# Patient Record
Sex: Female | Born: 1990 | Race: Black or African American | Hispanic: No | Marital: Single | Smoking: Former smoker
Health system: Southern US, Community
[De-identification: ages and names within clinical notes are randomized; demographics above are authoritative.]

## PROBLEM LIST (undated history)

## (undated) ENCOUNTER — Inpatient Hospital Stay (HOSPITAL_COMMUNITY): Payer: Self-pay

## (undated) DIAGNOSIS — F99 Mental disorder, not otherwise specified: Secondary | ICD-10-CM

## (undated) HISTORY — PX: NO PAST SURGERIES: SHX2092

---
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
## 2012-04-02 ENCOUNTER — Ambulatory Visit (INDEPENDENT_AMBULATORY_CARE_PROVIDER_SITE_OTHER): Payer: Medicaid Other | Admitting: *Deleted

## 2012-04-02 DIAGNOSIS — Z111 Encounter for screening for respiratory tuberculosis: Secondary | ICD-10-CM

## 2012-04-04 ENCOUNTER — Ambulatory Visit (INDEPENDENT_AMBULATORY_CARE_PROVIDER_SITE_OTHER): Payer: Medicaid Other | Admitting: *Deleted

## 2012-04-04 DIAGNOSIS — Z111 Encounter for screening for respiratory tuberculosis: Secondary | ICD-10-CM

## 2012-04-04 LAB — TB SKIN TEST
Induration: 0 mm
TB Skin Test: NEGATIVE

## 2012-05-16 NOTE — L&D Delivery Note (Signed)
Delivery Note At 1:52 AM a viable female was delivered via Vaginal, Spontaneous Delivery (Presentation: Left Occiput Anterior).  APGAR: 9, 9; weight .   Placenta status: Intact, Spontaneous.  Cord:  with the following complications: None  Anesthesia: Epidural  Episiotomy: None Lacerations: None Est. Blood Loss (mL): 150 mL  Mom to postpartum.  Baby to nursery-stable.  [name] Other 10/09/2012, 2:12 AM  Delivery was supervised by Nigel Bridgeman, CNM, as I was tied up in another patient's room at time of delivery

## 2012-07-12 ENCOUNTER — Encounter: Payer: Self-pay | Admitting: Family Medicine

## 2012-07-12 ENCOUNTER — Ambulatory Visit (INDEPENDENT_AMBULATORY_CARE_PROVIDER_SITE_OTHER): Payer: Self-pay | Admitting: Family Medicine

## 2012-07-12 VITALS — BP 103/70 | HR 96 | Temp 98.3°F

## 2012-07-12 DIAGNOSIS — K0889 Other specified disorders of teeth and supporting structures: Secondary | ICD-10-CM

## 2012-07-12 DIAGNOSIS — K089 Disorder of teeth and supporting structures, unspecified: Secondary | ICD-10-CM

## 2012-07-12 MED ORDER — TRAMADOL HCL 50 MG PO TABS
50.0000 mg | ORAL_TABLET | Freq: Three times a day (TID) | ORAL | Status: DC | PRN
Start: 2012-07-12 — End: 2012-09-06

## 2012-07-12 NOTE — Patient Instructions (Addendum)
I have sent in a pain medication for you.  You still need to get to the dentist soon as that tooth will continue to hurt until they work on it. If you develop fevers, chills, nausea, or vomiting, please come back to be seen.

## 2012-07-12 NOTE — Progress Notes (Signed)
Patient ID: Kylie Bell, female   DOB: 05-Jul-1990, 22 y.o.   MRN: [id number] Subjective: The patient is a 22 y.o. year old female who presents today for tooth pain, present x2-3 weeks, gradually worsening.  Left upper jaw.  No fevers/chills, n/v/d, problems breathing/facial swelling  Patient's past medical, social, and family history were reviewed and updated as appropriate. History  Substance Use Topics  . Smoking status: Current Every Day Smoker    Types: Cigars  . Smokeless tobacco: Never Used     Comment: 2 black and milds per day; admits to during pregnancy although denied use during clinic visits  . Alcohol Use: Yes   Objective:  Filed Vitals:   07/12/12 1427  BP: 103/70  Pulse: 96  Temp: 98.3 F (36.8 C)   Gen: NAD HEENT: Poor dentition.  Tooth in left upper jaw appears to have crack in it.  No swelling/drainage.  No significant pain with palpation over jaw.  Assessment/Plan: Dental pain from cracked tooth and/or caries.  No evidence of infection.  Will rx pain meds and rec seeing dentist in very near future.  Please also see individual problems in problem list for problem-specific plans.

## 2012-07-13 ENCOUNTER — Telehealth: Payer: Self-pay | Admitting: Family Medicine

## 2012-07-13 NOTE — Telephone Encounter (Signed)
Will forward to Dr Louanne Belton

## 2012-07-13 NOTE — Telephone Encounter (Signed)
Patient is calling because Medicaid doesn't cover the Tramadol so she needs something different that isn't generic.  She says she needs this as soon as possible.

## 2012-07-13 NOTE — Telephone Encounter (Signed)
Tramadol is available from either [facility] or [facility].  #120 (which is less than she has) should cost $15 or less.  There aren't really any less expensive options.

## 2012-07-13 NOTE — Telephone Encounter (Signed)
Pt says she goes to a [facility] and they told her it would cost $30+ and she cant afford that and it's not covered by MCD.

## 2012-07-16 MED ORDER — HYDROCODONE-ACETAMINOPHEN 5-325 MG PO TABS: 1.0000 | ORAL_TABLET | Freq: Three times a day (TID) | ORAL | Status: DC | PRN

## 2012-07-16 NOTE — Telephone Encounter (Signed)
Would you, or Larita Fife, please call in norco 5/325, #30, with directions to take up to 3 times daily as needed for pain.  No refills.  She will need to get in to see a dentist by the time that runs out.

## 2012-07-18 NOTE — Telephone Encounter (Signed)
Norco order called to [facility] per Dr Pasty Spillers advise pt if she calls back. LM for pt to rt call,

## 2012-07-19 NOTE — Telephone Encounter (Signed)
Called pt. Left message to call back. Please tell pt to check pharmacy for her meds (Norco). See previous message. Lorenda Hatchet, Renato Battles

## 2012-07-23 NOTE — Telephone Encounter (Signed)
Pt never called back. .Slade, The[place]la  

## 2012-09-06 ENCOUNTER — Inpatient Hospital Stay (HOSPITAL_COMMUNITY): Payer: Medicaid Other

## 2012-09-06 ENCOUNTER — Encounter (HOSPITAL_COMMUNITY): Payer: Self-pay | Admitting: *Deleted

## 2012-09-06 ENCOUNTER — Inpatient Hospital Stay (HOSPITAL_COMMUNITY)
Admission: AD | Admit: 2012-09-06 | Discharge: 2012-09-06 | Disposition: A | Discharge status: Home / Self Care | Payer: Medicaid Other | Source: Ambulatory Visit | Attending: Obstetrics & Gynecology | Admitting: Obstetrics & Gynecology

## 2012-09-06 DIAGNOSIS — O26879 Cervical shortening, unspecified trimester: Secondary | ICD-10-CM | POA: Insufficient documentation

## 2012-09-06 DIAGNOSIS — R109 Unspecified abdominal pain: Secondary | ICD-10-CM | POA: Insufficient documentation

## 2012-09-06 DIAGNOSIS — O0930 Supervision of pregnancy with insufficient antenatal care, unspecified trimester: Secondary | ICD-10-CM | POA: Insufficient documentation

## 2012-09-06 LAB — URINALYSIS, ROUTINE W REFLEX MICROSCOPIC
Glucose, UA: NEGATIVE mg/dL
Ketones, ur: NEGATIVE mg/dL
Leukocytes, UA: NEGATIVE
pH: 6.5 (ref 5.0–8.0)

## 2012-09-06 LAB — WET PREP, GENITAL
Trich, Wet Prep: NONE SEEN
Yeast Wet Prep HPF POC: NONE SEEN

## 2012-09-06 MED ORDER — ACETAMINOPHEN 325 MG PO TABS: 650.0000 mg | ORAL_TABLET | Freq: Four times a day (QID) | ORAL | Status: DC | PRN

## 2012-09-06 MED ORDER — PROGESTERONE MICRONIZED 200 MG PO CAPS: ORAL_CAPSULE | ORAL | Status: DC

## 2012-09-06 MED ORDER — BETAMETHASONE SOD PHOS & ACET 6 (3-3) MG/ML IJ SUSP
12.0000 mg | Freq: Once | INTRAMUSCULAR | Status: AC
  Administered 2012-09-06: 12 mg via INTRAMUSCULAR
  Filled 2012-09-06: qty 2

## 2012-09-06 MED ORDER — PRENATAL MULTIVITAMIN CH: 1.0000 | ORAL_TABLET | Freq: Every day | ORAL | Status: DC

## 2012-09-06 NOTE — MAU Note (Signed)
abd pain- in lower abd for past 2 days.  Knows she is pregnant. No prenatal care, still be bleeding.   Has a 65 month old.

## 2012-09-06 NOTE — MAU Provider Note (Signed)
History     CSN: [id number]  Arrival date and time: 09/06/12 1215   None     Chief Complaint  Patient presents with  . Abdominal Pain   HPI Comments: Pt is a 22 y.o. G2P1001 at unknown gestation but approximately [redacted]w[redacted]d by uncertain LMP who presents today for suprapubic and r sided pain X 1 day.  She is unsure of how far along she is in pregnancy.  She delivered her last baby in June.  Reports that her bleeding lasted approximately 4-6 weeks.  She then remembers having one period in August.  She has some subsequent bleeding in November the cannot recall this was similar her normal menses.  She reports she has been feeling well otherwise.  Having no headaches, vision changes, nausea, vomiting, or swelling.  She has started noticing the baby move and has noticed good fetal movement the past couple of weeks.  She has no dysuria, frequency, no hesitancy.  She reports no discharge.  Ligating from August 1 she is approximately 36 weeks.  She has previously been [facility] by Dr. Louanne Belton.  She did see him back in January but may no mention of potentially been pregnant as at that time she was unaware.  She reports not having to have gone to get her Medicaid card due to scheduling conflicts with her work.  She works third shift and Lurena Nida desires to continue working to the pregnancy.  He has not been taking prenatal vitamin reports that she did purchase some yesterday.   OB History   Grav Para Term Preterm Abortions TAB SAB Ect Mult Living   2 1 1  0 0 0 0 0 0 1      Past Medical History  Diagnosis Date  . Mental disorder     ADHD    Past Surgical History  Procedure Laterality Date  . No past surgeries      Family History  Problem Relation Age of Onset  . Diabetes Mother   . Kidney disease Mother   . Pancreatitis Mother   . Gout Father   . Cancer Maternal Grandmother   . Gout Paternal Grandmother   . Diabetes Paternal Grandmother     History   Substance Use Topics  . Smoking status: Current Every Day Smoker    Types: Cigars  . Smokeless tobacco: Never Used     Comment: 2 black and milds per day; admits to during pregnancy although denied use during clinic visits  . Alcohol Use: Yes    Allergies: No Known Allergies  Prescriptions prior to admission  Medication Sig Dispense Refill  . Multiple Vitamin (MULTIVITAMIN WITH MINERALS) TABS Take 1 tablet by mouth daily.  30 tablet  3  . traMADol (ULTRAM) 50 MG tablet Take 1-2 tablets (50-100 mg total) by mouth every 8 (eight) hours as needed for pain.  60 tablet  0    Review of Systems  Constitutional: Negative.  Negative for fever and chills.  HENT: Negative.  Negative for congestion.   Eyes: Negative.  Negative for blurred vision and double vision.  Respiratory: Negative.  Negative for cough and wheezing.   Cardiovascular: Negative.  Negative for leg swelling.  Gastrointestinal: Negative.  Negative for heartburn, nausea and vomiting.  Genitourinary: Negative.  Negative for dysuria, urgency and frequency.  Musculoskeletal: Negative.  Negative for myalgias, back pain and falls.  Skin: Negative.   Neurological: Negative.  Negative for headaches.  Endo/Heme/Allergies: Negative.   Psychiatric/Behavioral: Negative.  Physical Exam   Blood pressure 105/62, pulse 70, temperature 97.9 F (36.6 C), temperature source Oral, resp. rate 18, height 4\' 11"  (1.499 m), weight 79.379 kg (175 lb), last menstrual period 12/29/2011.  Physical Exam  Nursing note and vitals reviewed. Constitutional: She is oriented to person, place, and time. She appears well-developed and well-nourished. No distress.  HENT:  Head: Normocephalic and atraumatic.  Eyes: Conjunctivae are normal. Pupils are equal, round, and reactive to light.  Neck: Normal range of motion. No JVD present. No tracheal deviation present.  Cardiovascular: Normal rate.   Respiratory: Effort normal. No respiratory distress.  GI:  Soft. She exhibits distension (Gravid).  Genitourinary: Uterus is enlarged (gravid.  Measure to 28cm on abdominal measurement). Cervix exhibits discharge. Cervix exhibits no motion tenderness and no friability. Right adnexum displays tenderness (R > L mild over round ligament). Right adnexum displays no mass. Left adnexum displays no mass. No tenderness around the vagina. Vaginal discharge (white with greenish/yellowsih tint.  Copious) found.  Musculoskeletal: She exhibits no edema and no tenderness.  Neurological: She is alert and oriented to person, place, and time. She exhibits normal muscle tone.  Skin: Skin is warm and dry. No rash noted. She is not diaphoretic. No erythema. No pallor.  Psychiatric: She has a normal mood and affect. Her behavior is normal. Judgment and thought content normal.      MAU Course  Procedures  MDM 2:25 PM - Pt to [place] for dating only.  Collected urinalysis, GC/Chlamydia, wet prep.      Assessment and Plan   Shortened Cervix   Late to Prenatal Care  Round Ligament Pain   Plan: DC to home RX Prometrium 200 mg HS per vagina Appointment at [facility] on Sep 27, 2012 at 08:00am Outpatient ultrasound for anatomy. BMZ 12 mg IM today, repeat in 24 hours Tylenol for round ligament pain   Andrena Mews, DO [facility] Resident - PGY-2 09/06/2012 2:26 PM

## 2012-09-07 ENCOUNTER — Inpatient Hospital Stay (HOSPITAL_COMMUNITY)
Admission: AD | Admit: 2012-09-07 | Discharge: 2012-09-07 | Disposition: A | Discharge status: Home / Self Care | Payer: Medicaid Other | Source: Ambulatory Visit | Attending: Obstetrics & Gynecology | Admitting: Obstetrics & Gynecology

## 2012-09-07 DIAGNOSIS — O4700 False labor before 37 completed weeks of gestation, unspecified trimester: Secondary | ICD-10-CM | POA: Insufficient documentation

## 2012-09-07 LAB — GC/CHLAMYDIA PROBE AMP: CT Probe RNA: NEGATIVE

## 2012-09-07 MED ORDER — BETAMETHASONE SOD PHOS & ACET 6 (3-3) MG/ML IJ SUSP
12.0000 mg | Freq: Once | INTRAMUSCULAR | Status: AC
  Administered 2012-09-07: 12 mg via INTRAMUSCULAR
  Filled 2012-09-07: qty 2

## 2012-09-09 ENCOUNTER — Inpatient Hospital Stay (HOSPITAL_COMMUNITY)
Admission: AD | Admit: 2012-09-09 | Discharge: 2012-09-09 | Disposition: A | Discharge status: Home / Self Care | Payer: Medicaid Other | Source: Ambulatory Visit | Attending: Obstetrics & Gynecology | Admitting: Obstetrics & Gynecology

## 2012-09-09 ENCOUNTER — Encounter (HOSPITAL_COMMUNITY): Payer: Self-pay

## 2012-09-09 DIAGNOSIS — N949 Unspecified condition associated with female genital organs and menstrual cycle: Secondary | ICD-10-CM | POA: Insufficient documentation

## 2012-09-09 DIAGNOSIS — O26899 Other specified pregnancy related conditions, unspecified trimester: Secondary | ICD-10-CM

## 2012-09-09 DIAGNOSIS — O26893 Other specified pregnancy related conditions, third trimester: Secondary | ICD-10-CM

## 2012-09-09 DIAGNOSIS — O99891 Other specified diseases and conditions complicating pregnancy: Secondary | ICD-10-CM | POA: Insufficient documentation

## 2012-09-09 LAB — WET PREP, GENITAL
Trich, Wet Prep: NONE SEEN
Yeast Wet Prep HPF POC: NONE SEEN

## 2012-09-09 NOTE — MAU Provider Note (Signed)
History     CSN: [id number]  Arrival date and time: 09/09/12 2129   First Provider Initiated Contact with Patient 09/09/12 2204      Chief Complaint  Patient presents with  . Rupture of Membranes   HPI Ms. Kylie Bell is a 22 y.o. G2P1001 at [redacted]w[redacted]d who presents to MAU today with complaint of possible ROM. The patient states that while she was getting ready to leave for work she felt some fluid leaking from her vagina. She did not feel a gush. She is having some lower abdominal pressure. Denies pain, contractions, vaginal discharge or vaginal bleeding. [place] on 09/06/12 showed short cervix. Patient has not yet started Rx for Prometrium. She reports good fetal movement.    OB History   Grav Para Term Preterm Abortions TAB SAB Ect Mult Living   2 1 1  0 0 0 0 0 0 1      Past Medical History  Diagnosis Date  . Mental disorder     ADHD    Past Surgical History  Procedure Laterality Date  . No past surgeries      Family History  Problem Relation Age of Onset  . Diabetes Mother   . Kidney disease Mother   . Pancreatitis Mother   . Gout Father   . Cancer Maternal Grandmother   . Gout Paternal Grandmother   . Diabetes Paternal Grandmother     History  Substance Use Topics  . Smoking status: Former Smoker    Types: Cigars  . Smokeless tobacco: Never Used     Comment: 2 black and milds per day; admits to during pregnancy although denied use during clinic visits  . Alcohol Use: Yes    Allergies: No Known Allergies  Prescriptions prior to admission  Medication Sig Dispense Refill  . acetaminophen (TYLENOL) 325 MG tablet Take 2 tablets (650 mg total) by mouth every 6 (six) hours as needed for pain.      . Prenatal Vit-Fe Fumarate-FA (PRENATAL MULTIVITAMIN) TABS Take 1 tablet by mouth daily at 12 noon.      . progesterone (PROMETRIUM) 200 MG capsule Insert in vagina nightly  30 capsule  1    Review of Systems  Gastrointestinal: Negative for abdominal pain.   Genitourinary: Negative for dysuria, urgency and frequency.       + LOF Neg - vaginal discharge, bleeding   Physical Exam   Blood pressure 125/55, pulse 70, temperature 97.9 F (36.6 C), temperature source Oral, resp. rate 18, height 4\' 11"  (1.499 m), weight 177 lb (80.287 kg), last menstrual period 12/29/2011, SpO2 100.00%.  Physical Exam  Constitutional: She is oriented to person, place, and time. She appears well-developed and well-nourished. No distress.  HENT:  Head: Normocephalic and atraumatic.  Cardiovascular: Normal rate, regular rhythm and normal heart sounds.   Respiratory: Effort normal and breath sounds normal. No respiratory distress.  GI: Soft. Bowel sounds are normal. She exhibits no distension and no mass. There is no tenderness. There is no rebound and no guarding.  Genitourinary: Uterus is enlarged (appropriate for GA). Uterus is not tender. Cervix exhibits friability. Cervix exhibits no motion tenderness and no discharge. No bleeding around the vagina. Vaginal discharge (scant amount of mucus discharge noted) found.  Pooling - negative Fern - negative  Neurological: She is alert and oriented to person, place, and time.  Skin: Skin is warm and dry. No erythema.  Psychiatric: She has a normal mood and affect.  Dilation: 1 Effacement (%): 60 Station: -  3 Presentation: Vertex Exam by:: Lucy Chris RNC  Results for orders placed during the hospital encounter of 09/09/12 (from the past 24 hour(s))  WET PREP, GENITAL     Status: Abnormal   Collection Time    09/09/12 10:23 PM      Result Value Range   Yeast Wet Prep HPF POC NONE SEEN  NONE SEEN   Trich, Wet Prep NONE SEEN  NONE SEEN   Clue Cells Wet Prep HPF POC NONE SEEN  NONE SEEN   WBC, Wet Prep HPF POC FEW (*) NONE SEEN  POCT FERN TEST     Status: None   Collection Time    09/09/12 10:31 PM      Result Value Range   POCT Fern Test Negative = intact amniotic membranes      Fetal monitoring: Baseline: 130  bpm, moderate variability, + accelerations, no decelerations Contractions: occasional  MAU Course  Procedures None  MDM Fern slide negative. No pooling. No contractions. Reactive NST. Wet prep negative.   Assessment and Plan  A: Vaginal discharge  P: Discharge home Encouraged patient to start Rx given at last visit Encouraged patient to keep appointment to start prenatal care in clinic Labor precautions discussed Patient may return to MAU as needed or if her condition were to change or worsen  Freddi Starr, PA-C  09/09/2012, 11:15 PM

## 2012-09-09 NOTE — MAU Note (Signed)
Pt reports she had a gush of fluid at 2100, states it was not urine.

## 2012-09-27 ENCOUNTER — Ambulatory Visit (INDEPENDENT_AMBULATORY_CARE_PROVIDER_SITE_OTHER): Payer: Self-pay | Admitting: Obstetrics & Gynecology

## 2012-09-27 ENCOUNTER — Encounter: Payer: Self-pay | Admitting: Obstetrics & Gynecology

## 2012-09-27 VITALS — BP 111/68 | Temp 98.8°F | Wt 176.3 lb

## 2012-09-27 DIAGNOSIS — O0930 Supervision of pregnancy with insufficient antenatal care, unspecified trimester: Secondary | ICD-10-CM | POA: Insufficient documentation

## 2012-09-27 DIAGNOSIS — O0933 Supervision of pregnancy with insufficient antenatal care, third trimester: Secondary | ICD-10-CM

## 2012-09-27 LAB — POCT URINALYSIS DIP (DEVICE)
Bilirubin Urine: NEGATIVE
Ketones, ur: NEGATIVE mg/dL
Protein, ur: NEGATIVE mg/dL

## 2012-09-27 NOTE — Progress Notes (Signed)
   Subjective:    Kylie Bell is a G2P1001 [redacted]w[redacted]d being seen today for her first obstetrical visit.  Her obstetrical history is significant for late prenatal care. Patient does not intend to breast feed. Pregnancy history fully reviewed.  Patient reports no contractions.  Filed Vitals:   09/27/12 0829  BP: 111/68  Temp: 98.8 F (37.1 C)  Weight: 176 lb 4.8 oz (79.969 kg)    HISTORY: OB History   Grav Para Term Preterm Abortions TAB SAB Ect Mult Living   2 1 1  0 0 0 0 0 0 1     # Outc Date GA Lbr Len/2nd Wgt Sex Del Anes PTL Lv   1 TRM 6/13 [redacted]w[redacted]d 20:30 / 00:48 7lb1.9oz(3.23kg) F SVD EPI  Yes   2 CUR              Past Medical History  Diagnosis Date  . Mental disorder     ADHD   Past Surgical History  Procedure Laterality Date  . No past surgeries     Family History  Problem Relation Age of Onset  . Diabetes Mother   . Kidney disease Mother   . Pancreatitis Mother   . Gout Father   . Cancer Maternal Grandmother   . Gout Paternal Grandmother   . Diabetes Paternal Grandmother      Exam    Uterus:     Pelvic Exam:    Perineum: No Hemorrhoids   Vulva: normal   Vagina:  normal mucosa   pH:     Cervix: no lesions and 2 cm/50%/vtx   Adnexa: no mass, fullness, tenderness   Bony Pelvis: average  System: Breast:  normal appearance, no masses or tenderness   Skin: normal coloration and turgor, no rashes    Neurologic: oriented, normal mood   Extremities: normal strength, tone, and muscle mass   HEENT RRR   Mouth/Teeth mucous membranes moist, pharynx normal without lesions   Neck supple and no masses   Cardiovascular: regular rate and rhythm   Respiratory:  appears well, vitals normal, no respiratory distress, acyanotic, normal RR   Abdomen: normal findings: gravid, NT   Urinary: urethral meatus normal      Assessment:    Pregnancy: G2P1001 Patient Active Problem List   Diagnosis Date Noted  . Late prenatal care complicating pregnancy 09/27/2012   . ATTENTION DEFICIT, W/HYPERACTIVITY 07/13/2006  . ECZEMA, ATOPIC DERMATITIS 07/13/2006  . DELAYED MILESTONE 07/13/2006        Plan:     Initial labs drawn. Prenatal vitamins. Problem list reviewed and updated. Genetic Screening discussed too late.  Ultrasound discussed; fetal survey: requested.  Follow up in 1 weeks. 50% of 30 min visit spent on counseling and coordination of care.  GC/CT, GBS done,   Modesta Sammons 09/27/2012

## 2012-09-27 NOTE — Patient Instructions (Addendum)
Pregnancy - Third Trimester  The third trimester of pregnancy (the last 3 months) is a period of the most rapid growth for you and your baby. The baby approaches a length of 20 inches and a weight of 6 to 10 pounds. The baby is adding on fat and getting ready for life outside your body. While inside, babies have periods of sleeping and waking, suck their thumbs, and hiccups. You can often feel small contractions of the uterus. This is false labor. It is also called Braxton-Hicks contractions. This is like a practice for labor. The usual problems in this stage of pregnancy include more difficulty breathing, swelling of the hands and feet from water retention, and having to urinate more often because of the uterus and baby pressing on your bladder.   PRENATAL EXAMS  · Blood work may continue to be done during prenatal exams. These tests are done to check on your health and the probable health of your baby. Blood work is used to follow your blood levels (hemoglobin). Anemia (low hemoglobin) is common during pregnancy. Iron and vitamins are given to help prevent this. You may also continue to be checked for diabetes. Some of the past blood tests may be done again.  · The size of the uterus is measured during each visit. This makes sure your baby is growing properly according to your pregnancy dates.  · Your blood pressure is checked every prenatal visit. This is to make sure you are not getting toxemia.  · Your urine is checked every prenatal visit for infection, diabetes and protein.  · Your weight is checked at each visit. This is done to make sure gains are happening at the suggested rate and that you and your baby are growing normally.  · Sometimes, an ultrasound is performed to confirm the position and the proper growth and development of the baby. This is a test done that bounces harmless sound waves off the baby so your caregiver can more accurately determine due dates.  · Discuss the type of pain medication and  anesthesia you will have during your labor and delivery.  · Discuss the possibility and anesthesia if a Cesarean Section might be necessary.  · Inform your caregiver if there is any mental or physical violence at home.  Sometimes, a specialized non-stress test, contraction stress test and biophysical profile are done to make sure the baby is not having a problem. Checking the amniotic fluid surrounding the baby is called an amniocentesis. The amniotic fluid is removed by sticking a needle into the belly (abdomen). This is sometimes done near the end of pregnancy if an early delivery is required. In this case, it is done to help make sure the baby's lungs are mature enough for the baby to live outside of the womb. If the lungs are not mature and it is unsafe to deliver the baby, an injection of cortisone medication is given to the mother 1 to 2 days before the delivery. This helps the baby's lungs mature and makes it safer to deliver the baby.  CHANGES OCCURING IN THE THIRD TRIMESTER OF PREGNANCY  Your body goes through many changes during pregnancy. They vary from person to person. Talk to your caregiver about changes you notice and are concerned about.  · During the last trimester, you have probably had an increase in your appetite. It is normal to have cravings for certain foods. This varies from person to person and pregnancy to pregnancy.  · You may begin to   get stretch marks on your hips, abdomen, and breasts. These are normal changes in the body during pregnancy. There are no exercises or medications to take which prevent this change.  · Constipation may be treated with a stool softener or adding bulk to your diet. Drinking lots of fluids, fiber in vegetables, fruits, and whole grains are helpful.  · Exercising is also helpful. If you have been very active up until your pregnancy, most of these activities can be continued during your pregnancy. If you have been less active, it is helpful to start an exercise  program such as walking. Consult your caregiver before starting exercise programs.  · Avoid all smoking, alcohol, un-prescribed drugs, herbs and "street drugs" during your pregnancy. These chemicals affect the formation and growth of the baby. Avoid chemicals throughout the pregnancy to ensure the delivery of a healthy infant.  · Backache, varicose veins and hemorrhoids may develop or get worse.  · You will tire more easily in the third trimester, which is normal.  · The baby's movements may be stronger and more often.  · You may become short of breath easily.  · Your belly button may stick out.  · A yellow discharge may leak from your breasts called colostrum.  · You may have a bloody mucus discharge. This usually occurs a few days to a week before labor begins.  HOME CARE INSTRUCTIONS   · Keep your caregiver's appointments. Follow your caregiver's instructions regarding medication use, exercise, and diet.  · During pregnancy, you are providing food for you and your baby. Continue to eat regular, well-balanced meals. Choose foods such as meat, fish, milk and other low fat dairy products, vegetables, fruits, and whole-grain breads and cereals. Your caregiver will tell you of the ideal weight gain.  · A physical sexual relationship may be continued throughout pregnancy if there are no other problems such as early (premature) leaking of amniotic fluid from the membranes, vaginal bleeding, or belly (abdominal) pain.  · Exercise regularly if there are no restrictions. Check with your caregiver if you are unsure of the safety of your exercises. Greater weight gain will occur in the last 2 trimesters of pregnancy. Exercising helps:  · Control your weight.  · Get you in shape for labor and delivery.  · You lose weight after you deliver.  · Rest a lot with legs elevated, or as needed for leg cramps or low back pain.  · Wear a good support or jogging bra for breast tenderness during pregnancy. This may help if worn during  sleep. Pads or tissues may be used in the bra if you are leaking colostrum.  · Do not use hot tubs, steam rooms, or saunas.  · Wear your seat belt when driving. This protects you and your baby if you are in an accident.  · Avoid raw meat, cat litter boxes and soil used by cats. These carry germs that can cause birth defects in the baby.  · It is easier to loose urine during pregnancy. Tightening up and strengthening the pelvic muscles will help with this problem. You can practice stopping your urination while you are going to the bathroom. These are the same muscles you need to strengthen. It is also the muscles you would use if you were trying to stop from passing gas. You can practice tightening these muscles up 10 times a set and repeating this about 3 times per day. Once you know what muscles to tighten up, do not perform these   exercises during urination. It is more likely to cause an infection by backing up the urine.  · Ask for help if you have financial, counseling or nutritional needs during pregnancy. Your caregiver will be able to offer counseling for these needs as well as refer you for other special needs.  · Make a list of emergency phone numbers and have them available.  · Plan on getting help from family or friends when you go home from the hospital.  · Make a trial run to the hospital.  · Take prenatal classes with the father to understand, practice and ask questions about the labor and delivery.  · Prepare the baby's room/nursery.  · Do not travel out of the city unless it is absolutely necessary and with the advice of your caregiver.  · Wear only low or no heal shoes to have better balance and prevent falling.  MEDICATIONS AND DRUG USE IN PREGNANCY  · Take prenatal vitamins as directed. The vitamin should contain 1 milligram of folic acid. Keep all vitamins out of reach of children. Only a couple vitamins or tablets containing iron may be fatal to a baby or young child when ingested.  · Avoid use  of all medications, including herbs, over-the-counter medications, not prescribed or suggested by your caregiver. Only take over-the-counter or prescription medicines for pain, discomfort, or fever as directed by your caregiver. Do not use aspirin, ibuprofen (Motrin®, Advil®, Nuprin®) or naproxen (Aleve®) unless OK'd by your caregiver.  · Let your caregiver also know about herbs you may be using.  · Alcohol is related to a number of birth defects. This includes fetal alcohol syndrome. All alcohol, in any form, should be avoided completely. Smoking will cause low birth rate and premature babies.  · Street/illegal drugs are very harmful to the baby. They are absolutely forbidden. A baby born to an addicted mother will be addicted at birth. The baby will go through the same withdrawal an adult does.  SEEK MEDICAL CARE IF:  You have any concerns or worries during your pregnancy. It is better to call with your questions if you feel they cannot wait, rather than worry about them.  DECISIONS ABOUT CIRCUMCISION  You may or may not know the sex of your baby. If you know your baby is a boy, it may be time to think about circumcision. Circumcision is the removal of the foreskin of the penis. This is the skin that covers the sensitive end of the penis. There is no proven medical need for this. Often this decision is made on what is popular at the time or based upon religious beliefs and social issues. You can discuss these issues with your caregiver or pediatrician.  SEEK IMMEDIATE MEDICAL CARE IF:   · An unexplained oral temperature above 102° F (38.9° C) develops, or as your caregiver suggests.  · You have leaking of fluid from the vagina (birth canal). If leaking membranes are suspected, take your temperature and tell your caregiver of this when you call.  · There is vaginal spotting, bleeding or passing clots. Tell your caregiver of the amount and how many pads are used.  · You develop a bad smelling vaginal discharge with  a change in the color from clear to white.  · You develop vomiting that lasts more than 24 hours.  · You develop chills or fever.  · You develop shortness of breath.  · You develop burning on urination.  · You loose more than 2 pounds of weight   or gain more than 2 pounds of weight or as suggested by your caregiver.  · You notice sudden swelling of your face, hands, and feet or legs.  · You develop belly (abdominal) pain. Round ligament discomfort is a common non-cancerous (benign) cause of abdominal pain in pregnancy. Your caregiver still must evaluate you.  · You develop a severe headache that does not go away.  · You develop visual problems, blurred or double vision.  · If you have not felt your baby move for more than 1 hour. If you think the baby is not moving as much as usual, eat something with sugar in it and lie down on your left side for an hour. The baby should move at least 4 to 5 times per hour. Call right away if your baby moves less than that.  · You fall, are in a car accident or any kind of trauma.  · There is mental or physical violence at home.  Document Released: 04/26/2001 Document Revised: 07/25/2011 Document Reviewed: 10/29/2008  ExitCare® Patient Information ©2013 [employer].

## 2012-09-27 NOTE — Progress Notes (Signed)
U/S scheduled 10/01/12 at 1 pm.

## 2012-09-28 LAB — OBSTETRIC PANEL
Antibody Screen: NEGATIVE
Basophils Relative: 0 % (ref 0–1)
Eosinophils Absolute: 0 10*3/uL (ref 0.0–0.7)
Eosinophils Relative: 0 % (ref 0–5)
HCT: 27.1 % — ABNORMAL LOW (ref 36.0–46.0)
Hemoglobin: 8.5 g/dL — ABNORMAL LOW (ref 12.0–15.0)
MCH: 21.3 pg — ABNORMAL LOW (ref 26.0–34.0)
MCHC: 31.4 g/dL (ref 30.0–36.0)
Monocytes Absolute: 0.4 10*3/uL (ref 0.1–1.0)
Monocytes Relative: 7 % (ref 3–12)
Rh Type: POSITIVE

## 2012-09-28 LAB — DRUG SCREEN, URINE
Benzodiazepines.: NEGATIVE
Marijuana Metabolite: NEGATIVE
Methadone: NEGATIVE
Propoxyphene: NEGATIVE

## 2012-09-28 LAB — HIV ANTIBODY (ROUTINE TESTING W REFLEX): HIV: NONREACTIVE

## 2012-09-28 LAB — GLUCOSE TOLERANCE, 1 HOUR (50G) W/O FASTING: Glucose, 1 Hour GTT: 95 mg/dL (ref 70–140)

## 2012-09-28 LAB — GC/CHLAMYDIA PROBE AMP: CT Probe RNA: NEGATIVE

## 2012-09-30 LAB — CULTURE, OB URINE

## 2012-10-01 ENCOUNTER — Other Ambulatory Visit: Payer: Self-pay | Admitting: Obstetrics & Gynecology

## 2012-10-01 ENCOUNTER — Ambulatory Visit (HOSPITAL_COMMUNITY)
Admission: RE | Admit: 2012-10-01 | Discharge: 2012-10-01 | Disposition: A | Discharge status: Home / Self Care | Payer: Medicaid Other | Source: Ambulatory Visit | Attending: Obstetrics & Gynecology | Admitting: Obstetrics & Gynecology

## 2012-10-01 DIAGNOSIS — Z3689 Encounter for other specified antenatal screening: Secondary | ICD-10-CM | POA: Insufficient documentation

## 2012-10-01 DIAGNOSIS — O0933 Supervision of pregnancy with insufficient antenatal care, third trimester: Secondary | ICD-10-CM

## 2012-10-01 DIAGNOSIS — O0930 Supervision of pregnancy with insufficient antenatal care, unspecified trimester: Secondary | ICD-10-CM | POA: Insufficient documentation

## 2012-10-01 LAB — HEMOGLOBINOPATHY EVALUATION
Hgb A2 Quant: 2.2 % (ref 2.2–3.2)
Hgb A: 97.8 % (ref 96.8–97.8)

## 2012-10-02 ENCOUNTER — Encounter (HOSPITAL_COMMUNITY): Payer: Self-pay | Admitting: *Deleted

## 2012-10-02 ENCOUNTER — Inpatient Hospital Stay (HOSPITAL_COMMUNITY)
Admission: AD | Admit: 2012-10-02 | Discharge: 2012-10-03 | Disposition: A | Discharge status: Home / Self Care | Payer: Medicaid Other | Source: Ambulatory Visit | Attending: Family Medicine | Admitting: Family Medicine

## 2012-10-02 DIAGNOSIS — R109 Unspecified abdominal pain: Secondary | ICD-10-CM | POA: Insufficient documentation

## 2012-10-02 DIAGNOSIS — O99891 Other specified diseases and conditions complicating pregnancy: Secondary | ICD-10-CM | POA: Insufficient documentation

## 2012-10-02 LAB — URINALYSIS, ROUTINE W REFLEX MICROSCOPIC
Bilirubin Urine: NEGATIVE
Protein, ur: NEGATIVE mg/dL
Urobilinogen, UA: 2 mg/dL — ABNORMAL HIGH (ref 0.0–1.0)

## 2012-10-02 NOTE — MAU Note (Signed)
Stomach tightening and having lower abdominal pain after eating, hurts worse when walking, denies vaginal bleeding.

## 2012-10-02 NOTE — MAU Note (Signed)
Pt states her stomach gets really tight. Pt states she felt the tightening 6 times today

## 2012-10-03 LAB — URINE CULTURE: Colony Count: 70000

## 2012-10-04 ENCOUNTER — Ambulatory Visit (INDEPENDENT_AMBULATORY_CARE_PROVIDER_SITE_OTHER): Payer: Medicaid Other | Admitting: Family Medicine

## 2012-10-04 VITALS — BP 98/63 | Temp 98.4°F | Wt 177.1 lb

## 2012-10-04 DIAGNOSIS — R82998 Other abnormal findings in urine: Secondary | ICD-10-CM

## 2012-10-04 DIAGNOSIS — O0930 Supervision of pregnancy with insufficient antenatal care, unspecified trimester: Secondary | ICD-10-CM

## 2012-10-04 DIAGNOSIS — O0933 Supervision of pregnancy with insufficient antenatal care, third trimester: Secondary | ICD-10-CM

## 2012-10-04 DIAGNOSIS — R8281 Pyuria: Secondary | ICD-10-CM

## 2012-10-04 LAB — POCT URINALYSIS DIP (DEVICE)
Ketones, ur: NEGATIVE mg/dL
Protein, ur: 30 mg/dL — AB
Specific Gravity, Urine: 1.025 (ref 1.005–1.030)
pH: 6.5 (ref 5.0–8.0)

## 2012-10-04 NOTE — Patient Instructions (Signed)
Vaginal Delivery  Your caregiver must first be sure you are in labor. Signs of labor include:   You may pass what is called "the mucus plug" before labor begins. This is a small amount of blood stained mucus.   Regular uterine contractions.   The time between contractions get closer together.   The discomfort and pain gradually gets more intense.   Pains are mostly located in the back.   Pains get worse when walking.   The cervix (the opening of the uterus) becomes thinner (begins to efface) and opens up (dilates).  Once you are in labor and admitted into the hospital or care center, your caregiver will do the following:   A complete physical examination.   Check your vital signs (blood pressure, pulse, temperature and the fetal heart rate).   Do a vaginal examination (using a sterile glove and lubricant) to determine:   The position (presentation) of the baby (head [vertex] or buttock first).   The level (station) of the baby's head in the birth canal.   The effacement and dilatation of the cervix.   You may have your pubic hair shaved and be given an enema depending on your caregiver and the circumstance.   An electronic monitor is usually placed on your abdomen. The monitor follows the length and intensity of the contractions, as well as the baby's heart rate.   Usually, your caregiver will insert an IV in your arm with a bottle of sugar water. This is done as a precaution so that medications can be given to you quickly during labor or delivery.  NORMAL LABOR AND DELIVERY IS DIVIDED UP INTO 3 STAGES:  First Stage  This is when regular contractions begin and the cervix begins to efface and dilate. This stage can last from 3 to 15 hours. The end of the first stage is when the cervix is 100% effaced and 10 centimeters dilated. Pain medications may be given by    Injection (morphine, demerol, etc.)    Regional anesthesia (spinal, caudal or epidural, anesthetics given in different locations of the spine). Paracervical pain medication may be given, which is an injection of and anesthetic on each side of the cervix.  A pregnant woman may request to have "Natural Childbirth" which is not to have any medications or anesthesia during her labor and delivery.  Second Stage  This is when the baby comes down through the birth canal (vagina) and is born. This can take 1 to 4 hours. As the baby's head comes down through the birth canal, you may feel like you are going to have a bowel movement. You will get the urge to bear down and push until the baby is delivered. As the baby's head is being delivered, the caregiver will decide if an episiotomy (a cut in the perineum and vagina area) is needed to prevent tearing of the tissue in this area. The episiotomy is sewn up after the delivery of the baby and placenta. Sometimes a mask with nitrous oxide is given for the mother to breath during the delivery of the baby to help if there is too much pain. The end of Stage 2 is when the baby is fully delivered. Then when the umbilical cord stops pulsating it is clamped and cut.  Third Stage  The third stage begins after the baby is completely delivered and ends after the placenta (afterbirth) is delivered. This usually takes 5 to 30 minutes. After the placenta is delivered, a medication is given   either by intravenous or injection to help contract the uterus and prevent bleeding. The third stage is not painful and pain medication is usually not necessary. If an episiotomy was done, it is repaired at this time.  After the delivery, the mother is watched and monitored closely for 1 to 2 hours to make sure there is no postpartum bleeding (hemorrhage). If there is a lot of bleeding, medication is given to contract the uterus and stop the bleeding.  Document Released: 02/09/2008 Document Revised: 01/25/2012 Document Reviewed: 02/09/2008   [employer] Patient Information 2014 [employer].

## 2012-10-04 NOTE — Progress Notes (Signed)
P = 77 

## 2012-10-04 NOTE — Progress Notes (Signed)
Kylie Bell is a 22 y.o. female G2P1001 at [redacted]w[redacted]d who presents to the [facility] for rountine checkup.  She denies any complaints.  She denies cramping or contractions, but feels the baby move regularly. She denies discharge or vaginal bleeding.  She had a SVD with her first child with no complications.  She is tolerating food and drink without complication.  She would like the Depo Shot for her birth control needs and is hoping to be seen here postpartum.  She is not planning to breast feed.

## 2012-10-04 NOTE — Progress Notes (Signed)
I saw and examined patient and agree with student note. Moderate leukocytes on UA. Last visit, culture showed 70,000 cfu mixed flora but will reculture today to be sure there is no UTI. Asymptomatic.

## 2012-10-06 LAB — CULTURE, OB URINE

## 2012-10-08 ENCOUNTER — Encounter (HOSPITAL_COMMUNITY): Payer: Self-pay | Admitting: *Deleted

## 2012-10-08 ENCOUNTER — Inpatient Hospital Stay (HOSPITAL_COMMUNITY)
Admission: AD | Admit: 2012-10-08 | Discharge: 2012-10-10 | DRG: 775 | Disposition: A | Discharge status: Home / Self Care | Payer: Medicaid Other | Source: Ambulatory Visit | Attending: Obstetrics & Gynecology | Admitting: Obstetrics & Gynecology

## 2012-10-08 DIAGNOSIS — O4290 Premature rupture of membranes, unspecified as to length of time between rupture and onset of labor, unspecified weeks of gestation: Principal | ICD-10-CM | POA: Diagnosis present

## 2012-10-08 LAB — CBC
Hemoglobin: 8.4 g/dL — ABNORMAL LOW (ref 12.0–15.0)
MCV: 68.8 fL — ABNORMAL LOW (ref 78.0–100.0)
Platelets: 288 10*3/uL (ref 150–400)

## 2012-10-08 MED ORDER — OXYTOCIN 40 UNITS IN LACTATED RINGERS INFUSION - SIMPLE MED: 62.5000 mL/h | INTRAVENOUS | Status: DC

## 2012-10-08 MED ORDER — ACETAMINOPHEN 325 MG PO TABS: 650.0000 mg | ORAL_TABLET | ORAL | Status: DC | PRN

## 2012-10-08 MED ORDER — OXYTOCIN BOLUS FROM INFUSION
500.0000 mL | INTRAVENOUS | Status: DC
  Administered 2012-10-09: 500 mL via INTRAVENOUS

## 2012-10-08 MED ORDER — NALBUPHINE SYRINGE 5 MG/0.5 ML
5.0000 mg | INJECTION | INTRAMUSCULAR | Status: DC | PRN
  Filled 2012-10-08: qty 0.5

## 2012-10-08 MED ORDER — OXYCODONE-ACETAMINOPHEN 5-325 MG PO TABS: 1.0000 | ORAL_TABLET | ORAL | Status: DC | PRN

## 2012-10-08 MED ORDER — IBUPROFEN 600 MG PO TABS: 600.0000 mg | ORAL_TABLET | Freq: Four times a day (QID) | ORAL | Status: DC | PRN

## 2012-10-08 MED ORDER — MISOPROSTOL 200 MCG PO TABS
50.0000 ug | ORAL_TABLET | Freq: Once | ORAL | Status: DC
  Filled 2012-10-08: qty 0.5

## 2012-10-08 MED ORDER — LACTATED RINGERS IV SOLN
INTRAVENOUS | Status: DC
  Administered 2012-10-08: 23:00:00 via INTRAVENOUS

## 2012-10-08 MED ORDER — LIDOCAINE HCL (PF) 1 % IJ SOLN
30.0000 mL | INTRAMUSCULAR | Status: DC | PRN
  Filled 2012-10-08 (×2): qty 30

## 2012-10-08 MED ORDER — CITRIC ACID-SODIUM CITRATE 334-500 MG/5ML PO SOLN: 30.0000 mL | ORAL | Status: DC | PRN

## 2012-10-08 MED ORDER — IBUPROFEN 600 MG PO TABS
600.0000 mg | ORAL_TABLET | Freq: Four times a day (QID) | ORAL | Status: DC | PRN
  Administered 2012-10-09: 600 mg via ORAL
  Filled 2012-10-08: qty 1

## 2012-10-08 MED ORDER — MISOPROSTOL 200 MCG PO TABS: 50.0000 ug | ORAL_TABLET | Freq: Once | ORAL | Status: DC

## 2012-10-08 MED ORDER — ONDANSETRON HCL 4 MG/2ML IJ SOLN: 4.0000 mg | Freq: Four times a day (QID) | INTRAMUSCULAR | Status: DC | PRN

## 2012-10-08 MED ORDER — LACTATED RINGERS IV SOLN: 500.0000 mL | INTRAVENOUS | Status: DC | PRN

## 2012-10-08 MED ORDER — FLEET ENEMA 7-19 GM/118ML RE ENEM: 1.0000 | ENEMA | Freq: Every day | RECTAL | Status: DC | PRN

## 2012-10-08 MED ORDER — LIDOCAINE HCL (PF) 1 % IJ SOLN: 30.0000 mL | INTRAMUSCULAR | Status: DC | PRN

## 2012-10-08 MED ORDER — OXYTOCIN 40 UNITS IN LACTATED RINGERS INFUSION - SIMPLE MED
62.5000 mL/h | INTRAVENOUS | Status: DC
  Administered 2012-10-09: 62.5 mL/h via INTRAVENOUS
  Filled 2012-10-08: qty 1000

## 2012-10-08 MED ORDER — OXYTOCIN BOLUS FROM INFUSION: 500.0000 mL | INTRAVENOUS | Status: DC

## 2012-10-08 NOTE — H&P (Signed)
Kylie Bell is a 22 y.o. G2P1001 at [redacted]w[redacted]d who presents with rupture of membranes. Patient reports that her water broke at 2000 after coming home from a cookout.  No vaginal bleeding, discharge, or contractions.   In the MAU, patient had positive fern test and was sent immediately back to L&D.  Of note: She has had limited prenatal care (began on 09/27/12). She has had no complications during her pregnancy.   Maternal Medical History:  Reason for admission: Nausea.    OB History   Grav Para Term Preterm Abortions TAB SAB Ect Mult Living   2 1 1  0 0 0 0 0 0 1     Past Medical History  Diagnosis Date  . Mental disorder     ADHD   Past Surgical History  Procedure Laterality Date  . No past surgeries     Family History: family history includes Cancer in her maternal grandmother; Diabetes in her mother and paternal grandmother; Gout in her father and paternal grandmother; Kidney disease in her mother; and Pancreatitis in her mother. Social History:  reports that she has quit smoking. Her smoking use included Cigars. She has never used smokeless tobacco. She reports that she does not drink alcohol or use illicit drugs.   Prenatal Transfer Tool  Maternal Diabetes: No Genetic Screening: Too Late Maternal Ultrasounds/Referrals: Normal Fetal Ultrasounds or other Referrals:  None Maternal Substance Abuse:  No Significant Maternal Medications:  None Significant Maternal Lab Results:  None Other Comments:  Late prenatal care  Review of Systems  Constitutional: Negative for fever and chills.  Eyes: Negative for blurred vision.  Respiratory: Negative for shortness of breath.   Cardiovascular: Negative for chest pain.  Gastrointestinal: Negative for nausea, vomiting and abdominal pain.  Genitourinary: Negative for dysuria.  Neurological: Negative for headaches.    Dilation: 1.5 Effacement (%): 70 Station: -1 Exam by:: B.Cagna,RN Blood pressure 124/62, pulse 67, temperature  98.7 F (37.1 C), temperature source Oral, resp. rate 18, height 5' (1.524 m), weight 80.287 kg (177 lb), last menstrual period 12/29/2011. Exam Physical Exam  Gen: well appearing, NAD. Heart: RRR. No murmurs. Lungs: CTAB. Abd: gravid but otherwise soft, nontender to palpation Ext: no appreciable lower extremity edema bilaterally Neuro: No focal deficits. Cervical exam:  Dilation: 1.5 Effacement (%): 70 Cervical Position: Posterior Station: -1 Presentation: Vertex Exam by:: B.Cagna,RN  Prenatal labs: ABO, Rh: O/POS/-- (05/15 1020) Antibody: NEG (05/15 1020) Rubella: 1.50 (05/15 1020) RPR: NON REAC (05/15 1020)  HBsAg: NEGATIVE (05/15 1020)  HIV: NON REACTIVE (05/15 1020)  GBS: Negative (05/15 0000)   Assessment/Plan: Kylie Bell is a 22 y.o. G2P1001 at [redacted]w[redacted]d who presents with PROM. - Admitting to L&D for induction of labor. - Will start PO cytotec now and then again in 4 hours.  Will then reassess. - Pain control: planning Epidural - Anticipate NSVD  [name] Other 10/08/2012, 10:06 PM  I have seen and examined this patient and agree the above assessment. CRESENZO-DISHMAN,Ashonti Leandro 10/08/2012 11:48 PM

## 2012-10-08 NOTE — MAU Note (Signed)
Leaking clear fluid since 30 minutes ago. Denies contractions.

## 2012-10-08 NOTE — MAU Note (Signed)
Clear fluid SROM at 2000

## 2012-10-09 ENCOUNTER — Encounter: Payer: Self-pay | Admitting: Family Medicine

## 2012-10-09 ENCOUNTER — Inpatient Hospital Stay (HOSPITAL_COMMUNITY): Payer: Medicaid Other | Admitting: Anesthesiology

## 2012-10-09 ENCOUNTER — Encounter (HOSPITAL_COMMUNITY): Payer: Self-pay | Admitting: Anesthesiology

## 2012-10-09 LAB — CBC
MCH: 21.2 pg — ABNORMAL LOW (ref 26.0–34.0)
MCHC: 30.7 g/dL (ref 30.0–36.0)
MCV: 69.1 fL — ABNORMAL LOW (ref 78.0–100.0)
Platelets: 279 10*3/uL (ref 150–400)
RBC: 3.82 MIL/uL — ABNORMAL LOW (ref 3.87–5.11)
RDW: 17.5 % — ABNORMAL HIGH (ref 11.5–15.5)

## 2012-10-09 LAB — TYPE AND SCREEN: Antibody Screen: NEGATIVE

## 2012-10-09 LAB — RPR: RPR Ser Ql: NONREACTIVE

## 2012-10-09 MED ORDER — FENTANYL 2.5 MCG/ML BUPIVACAINE 1/10 % EPIDURAL INFUSION (WH - ANES)
14.0000 mL/h | INTRAMUSCULAR | Status: DC | PRN
  Filled 2012-10-09: qty 125

## 2012-10-09 MED ORDER — ZOLPIDEM TARTRATE 5 MG PO TABS: 5.0000 mg | ORAL_TABLET | Freq: Every evening | ORAL | Status: DC | PRN

## 2012-10-09 MED ORDER — DIPHENHYDRAMINE HCL 50 MG/ML IJ SOLN: 12.5000 mg | INTRAMUSCULAR | Status: DC | PRN

## 2012-10-09 MED ORDER — LACTATED RINGERS IV SOLN
500.0000 mL | Freq: Once | INTRAVENOUS | Status: AC
  Administered 2012-10-09: 500 mL via INTRAVENOUS

## 2012-10-09 MED ORDER — LANOLIN HYDROUS EX OINT: TOPICAL_OINTMENT | CUTANEOUS | Status: DC | PRN

## 2012-10-09 MED ORDER — EPHEDRINE 5 MG/ML INJ
10.0000 mg | INTRAVENOUS | Status: DC | PRN
  Filled 2012-10-09: qty 2

## 2012-10-09 MED ORDER — DIBUCAINE 1 % RE OINT: 1.0000 "application " | TOPICAL_OINTMENT | RECTAL | Status: DC | PRN

## 2012-10-09 MED ORDER — WITCH HAZEL-GLYCERIN EX PADS: 1.0000 "application " | MEDICATED_PAD | CUTANEOUS | Status: DC | PRN

## 2012-10-09 MED ORDER — ONDANSETRON HCL 4 MG PO TABS: 4.0000 mg | ORAL_TABLET | ORAL | Status: DC | PRN

## 2012-10-09 MED ORDER — BENZOCAINE-MENTHOL 20-0.5 % EX AERO: 1.0000 "application " | INHALATION_SPRAY | CUTANEOUS | Status: DC | PRN

## 2012-10-09 MED ORDER — EPHEDRINE 5 MG/ML INJ
10.0000 mg | INTRAVENOUS | Status: DC | PRN
  Filled 2012-10-09: qty 2
  Filled 2012-10-09: qty 4

## 2012-10-09 MED ORDER — ONDANSETRON HCL 4 MG/2ML IJ SOLN: 4.0000 mg | INTRAMUSCULAR | Status: DC | PRN

## 2012-10-09 MED ORDER — TETANUS-DIPHTH-ACELL PERTUSSIS 5-2.5-18.5 LF-MCG/0.5 IM SUSP
0.5000 mL | Freq: Once | INTRAMUSCULAR | Status: AC
  Administered 2012-10-10: 0.5 mL via INTRAMUSCULAR
  Filled 2012-10-09: qty 0.5

## 2012-10-09 MED ORDER — OXYCODONE-ACETAMINOPHEN 5-325 MG PO TABS
1.0000 | ORAL_TABLET | ORAL | Status: DC | PRN
  Administered 2012-10-09: 1 via ORAL
  Filled 2012-10-09: qty 1

## 2012-10-09 MED ORDER — SENNOSIDES-DOCUSATE SODIUM 8.6-50 MG PO TABS
2.0000 | ORAL_TABLET | Freq: Every day | ORAL | Status: DC
  Administered 2012-10-09: 2 via ORAL

## 2012-10-09 MED ORDER — LIDOCAINE HCL (PF) 1 % IJ SOLN
INTRAMUSCULAR | Status: DC | PRN
  Administered 2012-10-09 (×2): 8 mL

## 2012-10-09 MED ORDER — DIPHENHYDRAMINE HCL 25 MG PO CAPS
25.0000 mg | ORAL_CAPSULE | Freq: Four times a day (QID) | ORAL | Status: DC | PRN
  Administered 2012-10-10: 25 mg via ORAL
  Filled 2012-10-09: qty 1

## 2012-10-09 MED ORDER — PRENATAL MULTIVITAMIN CH
1.0000 | ORAL_TABLET | Freq: Every day | ORAL | Status: DC
  Administered 2012-10-09 – 2012-10-10 (×2): 1 via ORAL
  Filled 2012-10-09 (×2): qty 1

## 2012-10-09 MED ORDER — PHENYLEPHRINE 40 MCG/ML (10ML) SYRINGE FOR IV PUSH (FOR BLOOD PRESSURE SUPPORT)
80.0000 ug | PREFILLED_SYRINGE | INTRAVENOUS | Status: DC | PRN
  Filled 2012-10-09: qty 5
  Filled 2012-10-09: qty 2

## 2012-10-09 MED ORDER — SIMETHICONE 80 MG PO CHEW: 80.0000 mg | CHEWABLE_TABLET | ORAL | Status: DC | PRN

## 2012-10-09 MED ORDER — IBUPROFEN 600 MG PO TABS
600.0000 mg | ORAL_TABLET | Freq: Four times a day (QID) | ORAL | Status: DC
  Administered 2012-10-09 – 2012-10-10 (×5): 600 mg via ORAL
  Filled 2012-10-09 (×5): qty 1

## 2012-10-09 MED ORDER — FENTANYL 2.5 MCG/ML BUPIVACAINE 1/10 % EPIDURAL INFUSION (WH - ANES)
INTRAMUSCULAR | Status: DC | PRN
  Administered 2012-10-09: 14 mL/h via EPIDURAL

## 2012-10-09 MED ORDER — PHENYLEPHRINE 40 MCG/ML (10ML) SYRINGE FOR IV PUSH (FOR BLOOD PRESSURE SUPPORT)
80.0000 ug | PREFILLED_SYRINGE | INTRAVENOUS | Status: DC | PRN
  Filled 2012-10-09: qty 2

## 2012-10-09 NOTE — Anesthesia Postprocedure Evaluation (Signed)
Anesthesia Post Note  Patient: Kylie Bell  Procedure(s) Performed: * No procedures listed *  Anesthesia type: Epidural  Patient location: Mother/Baby  Post pain: Pain level controlled  Post assessment: Post-op Vital signs reviewed  Last Vitals:  Filed Vitals:   10/09/12 0840  BP: 107/69  Pulse: 59  Temp: 36.9 C  Resp: 16    Post vital signs: Reviewed  Level of consciousness:alert  Complications: No apparent anesthesia complications

## 2012-10-09 NOTE — Anesthesia Procedure Notes (Signed)
Epidural Patient location during procedure: OB Start time: 10/09/2012 1:03 AM End time: 10/09/2012 1:07 AM  Staffing Anesthesiologist: Sandrea Hughs Performed by: anesthesiologist   Preanesthetic Checklist Completed: patient identified, site marked, surgical consent, pre-op evaluation, timeout performed, IV checked, risks and benefits discussed and monitors and equipment checked  Epidural Patient position: sitting Prep: site prepped and draped and DuraPrep Patient monitoring: continuous pulse ox and blood pressure Approach: midline Injection technique: LOR air  Needle:  Needle type: Tuohy  Needle gauge: 17 G Needle length: 9 cm and 9 Needle insertion depth: 6 cm Catheter type: closed end flexible Catheter size: 19 Gauge Catheter at skin depth: 11 cm Test dose: negative  Assessment Sensory level: T9 Events: blood not aspirated, injection not painful, no injection resistance, negative IV test and no paresthesia  Additional Notes Reason for block:procedure for pain

## 2012-10-09 NOTE — Progress Notes (Signed)
Kylie Bell is a 22 y.o. G2P1001 at [redacted]w[redacted]d by ultrasound admitted for PROM.  Subjective: Now requesting epidural due to pressure/pain.  Objective: BP 114/64  Pulse 66  Temp(Src) 98.9 F (37.2 C) (Oral)  Resp 18  Ht 5' (1.524 m)  Wt 80.287 kg (177 lb)  BMI 34.57 kg/m2  LMP 12/29/2011      FHT:  FHR: 150 bpm, variability: moderate,  accelerations:  Present,  decelerations:  Absent UC:   irregular, every 3-10+ minutes SVE:   Dilation: 4 Effacement (%): 90 Station: -1 Exam by:: C Wicker RNC  Labs: Lab Results  Component Value Date   WBC 6.1 10/08/2012   HGB 8.4* 10/08/2012   HCT 27.4* 10/08/2012   MCV 68.8* 10/08/2012   PLT 288 10/08/2012    Assessment / Plan: Induction of labor due to PROM. S/P PO Cytotec x 1  Labor: Progressing normally Fetal Wellbeing:  Category I Pain Control:  Planning Epidural Anticipated MOD:  NSVD  [name] Other 10/09/2012, 12:36 AM

## 2012-10-09 NOTE — Progress Notes (Signed)
UR completed 

## 2012-10-09 NOTE — Anesthesia Preprocedure Evaluation (Signed)

## 2012-10-10 MED ORDER — IBUPROFEN 600 MG PO TABS: 600.0000 mg | ORAL_TABLET | Freq: Four times a day (QID) | ORAL | Status: DC

## 2012-10-10 MED ORDER — INTEGRA F 125-1 MG PO CAPS: 1.0000 | ORAL_CAPSULE | Freq: Every day | ORAL | Status: DC

## 2012-10-10 MED ORDER — MEDROXYPROGESTERONE ACETATE 150 MG/ML IM SUSP
150.0000 mg | Freq: Once | INTRAMUSCULAR | Status: AC
  Administered 2012-10-10: 150 mg via INTRAMUSCULAR
  Filled 2012-10-10: qty 1

## 2012-10-10 NOTE — Discharge Summary (Signed)
Attestation of Attending Supervision of Advanced Practitioner (CNM/NP): Evaluation and management procedures were performed by the Advanced Practitioner under my supervision and collaboration.  I have reviewed the Advanced Practitioner's note and chart, and I agree with the management and plan.  HARRAWAY-SMITH, Dejana Pugsley 11:50 AM     

## 2012-10-10 NOTE — Discharge Summary (Signed)
Obstetric Discharge Summary Reason for Admission: rupture of membranes Prenatal Procedures: none Intrapartum Procedures: spontaneous vaginal delivery Postpartum Procedures: none Complications-Operative and Postpartum: none  Kylie Bell is a 22 y.o. female G42P2002 who had SVD on 5/27 at 0200am.  She had originally presented to the MAU for ROM.  She states that she has been voiding and passing flatus without complication, though she has yet to have a bowel movement.  She states her pain is controlled with ibuprofen and she has no dizziness upon standing.  She will bottle feed with formula and plans to receive the Depo shot for her birth control needs downstairs in the [facility] where she will continue to receive postpartum care.  She is planning a circumcision for her son, but has not yet picked a location.    Hemoglobin  Date Value Range Status  10/09/2012 8.1* 12.0 - 15.0 g/dL Final     HCT  Date Value Range Status  10/09/2012 26.4* 36.0 - 46.0 % Final    Physical Exam:  General: cooperative, fatigued and no distress CVS: RRR, good S1 and S2, no RGM  Lungs: CTA bilaterally, no wheeze, rales, or rhonchi  Abd: Normoactive BS x4, non-distended; no tenderness on palpation Lochia: appropriate Uterine Fundus: firm DVT Evaluation: No evidence of DVT seen on physical exam.  Negative Homan's sign.  Discharge Diagnoses: Term Pregnancy-delivered  Discharge Information: Date: 10/10/2012 Activity: pelvic rest Diet: routine Medications: PNV, Ibuprofen, Colace and Iron Condition: stable Instructions: refer to practice specific booklet Discharge to: home   Newborn Data: Live born female  Birth Weight: 5 lb 9.4 oz (2534 g) APGAR: 9, 9  Home with mother.  Anna Genre 10/10/2012, 7:54 AM  I examined pt and agree with documentation above and PA-S plan of care. [facility]

## 2012-10-11 NOTE — Clinical Social Work Maternal (Signed)
    Clinical Social Work Department PSYCHOSOCIAL ASSESSMENT - MATERNAL/CHILD 10/11/2012  Patient:  BLENDA, WISECUP  Account Number:  [id number]  Admit Date:  10/08/2012  Marjo Bicker Name:   Grier Mitts    Clinical Social Worker:  Nobie Putnam, LCSW   Date/Time:  10/10/2012 12:00 N  Date Referred:  10/10/2012   Referral source  CN     Referred reason  [facility]   Other referral source:    I:  FAMILY / HOME ENVIRONMENT Child's legal guardian:  PARENT  Guardian - Name Guardian - Age Guardian - Address  Janice Bodine [address]; [place], [place] [address]  Ulla Potash 33    Other household support members/support persons Name Relationship DOB  Vee Bahe DAUGHTER 10/29/11   Other support:    II  PSYCHOSOCIAL DATA Information Source:  Patient Interview  Surveyor, quantity and Community Resources Employment:   Sports administrator resources:  Medicaid If Medicaid - County:  [place] Other  Sales executive  WIC   School / Grade:   Maternity Care Coordinator / Child Services Coordination / Early Interventions:  Cultural issues impacting care:    III  STRENGTHS Strengths  Adequate Resources  Home prepared for Child (including basic supplies)  Supportive family/friends   Strength comment:    IV  RISK FACTORS AND CURRENT PROBLEMS Current Problem:  YES   Risk Factor & Current Problem Patient Issue Family Issue Risk Factor / Current Problem Comment  Other - See comment [facility]    V  SOCIAL WORK ASSESSMENT CSW met with pt to assess reason for [facility] @ 35 weeks.  Pt explained that she works 7 days a week @ [employer] & therefore never had time to establish [facility].  CSW explained hospital drug testing policy & pt verbalized understanding. She denies any illegal substance use history.  UDS is negative, meconium results are pending.  She has all the necessary supplies for the infant & appears to be bonding well.  She identified her grandmother, Serenna Deroy as her  primary support person.  Pt appears to be appropriate at this time.  CSW will monitor drug screen results & make a referral if necessary.      VI SOCIAL WORK PLAN Social Work Plan  No Further Intervention Required / No Barriers to Discharge   Type of pt/family education:   If child protective services report - county:   If child protective services report - date:   Information/referral to community resources comment:   Other social work plan:

## 2012-11-05 ENCOUNTER — Ambulatory Visit: Payer: Self-pay | Admitting: Obstetrics & Gynecology

## 2012-11-05 ENCOUNTER — Ambulatory Visit: Payer: Self-pay | Admitting: Family Medicine

## 2012-11-17 ENCOUNTER — Other Ambulatory Visit (HOSPITAL_COMMUNITY)
Admission: RE | Admit: 2012-11-17 | Discharge: 2012-11-17 | Disposition: A | Discharge status: Home / Self Care | Payer: Self-pay | Source: Ambulatory Visit | Attending: Emergency Medicine | Admitting: Emergency Medicine

## 2012-11-17 ENCOUNTER — Emergency Department (INDEPENDENT_AMBULATORY_CARE_PROVIDER_SITE_OTHER)
Admission: EM | Admit: 2012-11-17 | Discharge: 2012-11-17 | Disposition: A | Discharge status: Home / Self Care | Payer: Medicaid Other | Source: Home / Self Care | Attending: Emergency Medicine | Admitting: Emergency Medicine

## 2012-11-17 ENCOUNTER — Encounter (HOSPITAL_COMMUNITY): Payer: Self-pay | Admitting: *Deleted

## 2012-11-17 DIAGNOSIS — Z113 Encounter for screening for infections with a predominantly sexual mode of transmission: Secondary | ICD-10-CM | POA: Insufficient documentation

## 2012-11-17 DIAGNOSIS — N760 Acute vaginitis: Secondary | ICD-10-CM | POA: Insufficient documentation

## 2012-11-17 DIAGNOSIS — N898 Other specified noninflammatory disorders of vagina: Secondary | ICD-10-CM

## 2012-11-17 DIAGNOSIS — N939 Abnormal uterine and vaginal bleeding, unspecified: Secondary | ICD-10-CM

## 2012-11-17 LAB — POCT URINALYSIS DIP (DEVICE)
Glucose, UA: NEGATIVE mg/dL
Leukocytes, UA: NEGATIVE
Nitrite: NEGATIVE
Protein, ur: NEGATIVE mg/dL
Urobilinogen, UA: 0.2 mg/dL (ref 0.0–1.0)

## 2012-11-17 MED ORDER — IBUPROFEN 800 MG PO TABS: 800.0000 mg | ORAL_TABLET | Freq: Three times a day (TID) | ORAL | Status: DC

## 2012-11-17 MED ORDER — OXYCODONE-ACETAMINOPHEN 5-325 MG PO TABS: ORAL_TABLET | ORAL | Status: DC

## 2012-11-17 MED ORDER — DIPHENHYDRAMINE HCL 25 MG PO CAPS: 25.0000 mg | ORAL_CAPSULE | Freq: Four times a day (QID) | ORAL | Status: DC | PRN

## 2012-11-17 MED ORDER — HYDROCODONE-ACETAMINOPHEN 5-325 MG PO TABS: ORAL_TABLET | ORAL | Status: DC

## 2012-11-17 NOTE — ED Provider Notes (Signed)
Chief Complaint:   Chief Complaint  Patient presents with  . Vaginal Bleeding  . Postpartum Complications    History of Present Illness:   Kylie Bell is a 22 year old female who presents today with postpartum vaginal bleeding. She delivered a healthy female infant on May 27, about 5 weeks ago vaginally. There were no immediate postpartum complications. She went home from the hospital in a couple of days. Her bleeding gradually went away. She returned to work 2 weeks after delivery, 3 weeks ago. Ever since then she's had heavy vaginal bleeding on the days she works, soaking through about 2 pads per day. She also has bilateral pelvic pain on the days she works. She does not have any bleeding or pain on her days off or holidays. Her work is extremely physical involving much lifting, pushing, pulling, and bending. She denies any fever or chills. There's been no nausea or vomiting. No vaginal discharge or itching. She has not resumed sexual activity since her delivery. She is due to see her obstetrician next week for a six-week postpartum visit.  Review of Systems:  Other than noted above, the patient denies any of the following symptoms: Systemic:  No fever, chills, sweats, or weight loss. GI:  No abdominal pain, nausea, anorexia, vomiting, diarrhea, constipation, melena or hematochezia. GU:  No dysuria, frequency, urgency, hematuria, vaginal discharge, itching, or abnormal vaginal bleeding. Skin:  No rash or itching.  PMFSH:  Past medical history, family history, social history, meds, and allergies were reviewed.   Physical Exam:   Vital signs:  BP 104/57  Pulse 66  Temp(Src) 98.7 F (37.1 C) (Temporal)  Resp 17  SpO2 99%  Breastfeeding? No General:  Alert, oriented and in no distress. Lungs:  Breath sounds clear and equal bilaterally.  No wheezes, rales or rhonchi. Heart:  Regular rhythm.  No gallops or murmers. Abdomen:  Soft, flat and non-distended.  No organomegaly or mass.  No  tenderness, guarding or rebound.  Bowel sounds normally active. Pelvic exam:  Normal external genitalia. Vaginal and cervical mucosa were normal. There is no bleeding, spotting, or foul-smelling lochia. There is mild cervical motion tenderness. Uterus is normal in size and shape and nontender. She has mild bilateral adnexal tenderness but no mass. Skin:  Clear, warm and dry.  Labs:   Results for orders placed during the hospital encounter of 11/17/12  POCT URINALYSIS DIP (DEVICE)      Result Value Range   Glucose, UA NEGATIVE  NEGATIVE mg/dL   Bilirubin Urine NEGATIVE  NEGATIVE   Ketones, ur TRACE (*) NEGATIVE mg/dL   Specific Gravity, Urine 1.025  1.005 - 1.030   Hgb urine dipstick NEGATIVE  NEGATIVE   pH 5.5  5.0 - 8.0   Protein, ur NEGATIVE  NEGATIVE mg/dL   Urobilinogen, UA 0.2  0.0 - 1.0 mg/dL   Nitrite NEGATIVE  NEGATIVE   Leukocytes, UA NEGATIVE  NEGATIVE    DNA probes for gonorrhea, Chlamydia, Gardnerella, Trichomonas, and candida obtained. I also obtained a regular culture for group B beta strep.  Assessment:  The encounter diagnosis was Vaginal bleeding.  Since she does not have a fever, I don't think she has endometritis. The fact that the bleeding stopped after her delivery then recurred, makes retained products of conception less likely in since she has not resumed sexual activity, PID is unlikely. It may be that she has returned to work too soon and hasn't completely healed up. I recommended that she rest for the next  week and she was given something for pain, although it's unlikely that she'll needed since she will not be doing any heavy lifting. I recommended she followup next week for 6 weeks postpartum checkup. If she should develop any fever, worsening pain, worsening bleeding suggested she return for recheck at [facility].  Plan:   1.  The following meds were prescribed:   New Prescriptions   DIPHENHYDRAMINE (BENADRYL) 25 MG CAPSULE    Take 1 capsule (25 mg  total) by mouth every 6 (six) hours as needed for itching.   IBUPROFEN (ADVIL,MOTRIN) 800 MG TABLET    Take 1 tablet (800 mg total) by mouth 3 (three) times daily.   OXYCODONE-ACETAMINOPHEN (PERCOCET) 5-325 MG PER TABLET    1 to 2 tablets every 6 hours as needed for pain.   2.  The patient was instructed in symptomatic care and handouts were given. 3.  The patient was told to return if becoming worse in any way, if no better in 3 or 4 days, and given some red flag symptoms such as fever, worsening pain, or worsening bleeding that would indicate earlier return. 4.  Follow up with her obstetrician for regular postpartum checkup, and if worse in any way in the meantime, at [facility]    Reuben Likes, MD 11/17/12 1919

## 2012-11-17 NOTE — ED Notes (Signed)
C/O heavy vaginal bleeding.  Pt is 5 wks postpartum with normal vaginal delivery without complications; states she had stopped bleeding after having baby, then went back to work at 2 wks postpartum - reports heavy vaginal bleeding every time she works (does lifting, moving objects, cleaning).  Bleeding goes away when she is off work, but returns as soon as she works again.  2 wks ago also started with low abd pains - worse with working and when laying down.  Denies fevers.  Denies being sexually active since giving birth.  Pt is not breastfeeding.

## 2012-11-19 LAB — POCT PREGNANCY, URINE: Preg Test, Ur: NEGATIVE

## 2012-11-20 LAB — CULTURE, BETA STREP (GROUP B ONLY)

## 2012-12-24 ENCOUNTER — Ambulatory Visit: Payer: Medicaid Other

## 2012-12-25 ENCOUNTER — Ambulatory Visit (INDEPENDENT_AMBULATORY_CARE_PROVIDER_SITE_OTHER): Payer: Medicaid Other | Admitting: *Deleted

## 2012-12-25 ENCOUNTER — Emergency Department (HOSPITAL_COMMUNITY)
Admission: EM | Admit: 2012-12-25 | Discharge: 2012-12-26 | Disposition: A | Discharge status: Home / Self Care | Payer: Medicaid Other | Attending: Emergency Medicine | Admitting: Emergency Medicine

## 2012-12-25 ENCOUNTER — Encounter (HOSPITAL_COMMUNITY): Payer: Self-pay | Admitting: *Deleted

## 2012-12-25 DIAGNOSIS — Z87891 Personal history of nicotine dependence: Secondary | ICD-10-CM | POA: Insufficient documentation

## 2012-12-25 DIAGNOSIS — R112 Nausea with vomiting, unspecified: Secondary | ICD-10-CM | POA: Insufficient documentation

## 2012-12-25 DIAGNOSIS — R109 Unspecified abdominal pain: Secondary | ICD-10-CM | POA: Insufficient documentation

## 2012-12-25 DIAGNOSIS — R197 Diarrhea, unspecified: Secondary | ICD-10-CM

## 2012-12-25 DIAGNOSIS — Z309 Encounter for contraceptive management, unspecified: Secondary | ICD-10-CM

## 2012-12-25 LAB — CBC WITH DIFFERENTIAL/PLATELET
Basophils Absolute: 0 10*3/uL (ref 0.0–0.1)
HCT: 33.3 % — ABNORMAL LOW (ref 36.0–46.0)
Lymphocytes Relative: 48 % — ABNORMAL HIGH (ref 12–46)
Monocytes Relative: 11 % (ref 3–12)
Neutro Abs: 1.9 10*3/uL (ref 1.7–7.7)
Platelets: 420 10*3/uL — ABNORMAL HIGH (ref 150–400)
RDW: 17.7 % — ABNORMAL HIGH (ref 11.5–15.5)
WBC: 4.9 10*3/uL (ref 4.0–10.5)

## 2012-12-25 LAB — COMPREHENSIVE METABOLIC PANEL
ALT: 20 U/L (ref 0–35)
AST: 25 U/L (ref 0–37)
Albumin: 3.7 g/dL (ref 3.5–5.2)
Alkaline Phosphatase: 64 U/L (ref 39–117)
Chloride: 102 mEq/L (ref 96–112)
Potassium: 3.2 mEq/L — ABNORMAL LOW (ref 3.5–5.1)
Total Bilirubin: 0.1 mg/dL — ABNORMAL LOW (ref 0.3–1.2)

## 2012-12-25 LAB — POCT URINE PREGNANCY: Preg Test, Ur: NEGATIVE

## 2012-12-25 MED ORDER — MEDROXYPROGESTERONE ACETATE 150 MG/ML IM SUSP
150.0000 mg | Freq: Once | INTRAMUSCULAR | Status: AC
  Administered 2012-12-25: 150 mg via INTRAMUSCULAR

## 2012-12-25 NOTE — ED Notes (Signed)
Pt notified that urine is needed.  Labeled sample cup in room

## 2012-12-25 NOTE — Progress Notes (Signed)
Pt here for depo. Depo given after negative preg test - LUOQ . Next depo due oct 27-11 nov Pt reminded to set up post partum check up and to meet new dr. Wyatt Haste, RN-BSN

## 2012-12-25 NOTE — ED Notes (Signed)
PT reports that she began having diarrhea yesterday morning; pt states that she has had diarrhea too many times to count; pt denies blood to stool; pt states that anytime she eats or drinks anything she has to go to the bathroom; pt denies abd pain but c/o abd cramps with diarrhea

## 2012-12-26 MED ORDER — POTASSIUM CHLORIDE CRYS ER 20 MEQ PO TBCR
30.0000 meq | EXTENDED_RELEASE_TABLET | Freq: Once | ORAL | Status: AC
  Administered 2012-12-26: 30 meq via ORAL
  Filled 2012-12-26: qty 2

## 2012-12-26 MED ORDER — LOPERAMIDE HCL 2 MG PO CAPS: 2.0000 mg | ORAL_CAPSULE | Freq: Four times a day (QID) | ORAL | Status: DC | PRN

## 2012-12-26 MED ORDER — ONDANSETRON 4 MG PO TBDP: 4.0000 mg | ORAL_TABLET | Freq: Three times a day (TID) | ORAL | Status: DC | PRN

## 2012-12-26 MED ORDER — LOPERAMIDE HCL 2 MG PO CAPS
4.0000 mg | ORAL_CAPSULE | Freq: Once | ORAL | Status: AC
  Administered 2012-12-26: 4 mg via ORAL
  Filled 2012-12-26: qty 2

## 2012-12-26 NOTE — ED Provider Notes (Signed)
CSN: [id number]     Arrival date & time 12/25/12  2213 History     First MD Initiated Contact with Patient 12/25/12 2317     Chief Complaint  Patient presents with  . Diarrhea   (Consider location/radiation/quality/duration/timing/severity/associated sxs/prior Treatment) HPI Comments: Patient presenting with a chief complaint of diarrhea.  Diarrhea began yesterday and has continued today.  She states that she has had too numerous to count episodes of diarrhea.    Yesterday she had several episodes of vomiting, but has not had any vomiting today.  She denies any blood in her emesis or blood in her stool.  She denies fever or chills.  She denies any abdominal pain, but does have occasional abdominal cramping.  She reports that she has been able to tolerate PO liquids today.  She has not taken anything for symptoms prior to arrival.  She denies any known sick contacts.  Denies any foreign travel, recent hospitalizations, or recent antibiotic use.  She reports that [facility] and had a negative urine pregnancy and was given a shot of Depo.    Patient is a 22 y.o. female presenting with diarrhea. The history is provided by the patient.  Diarrhea Associated symptoms: vomiting   Associated symptoms: no chills and no fever     Past Medical History  Diagnosis Date  . Mental disorder     ADHD   Past Surgical History  Procedure Laterality Date  . No past surgeries     Family History  Problem Relation Age of Onset  . Diabetes Mother   . Kidney disease Mother   . Pancreatitis Mother   . Gout Father   . Cancer Maternal Grandmother   . Gout Paternal Grandmother   . Diabetes Paternal Grandmother    History  Substance Use Topics  . Smoking status: Former Smoker    Types: Cigars    Quit date: 05/10/2012  . Smokeless tobacco: Never Used     Comment: 2 black and milds per day; admits to during pregnancy although denied use during clinic visits  . Alcohol Use:  No   OB History   Grav Para Term Preterm Abortions TAB SAB Ect Mult Living   2 2 2  0 0 0 0 0 0 2     Review of Systems  Constitutional: Negative for fever and chills.  Gastrointestinal: Positive for nausea, vomiting and diarrhea. Negative for blood in stool and abdominal distention.    Allergies  Review of patient's allergies indicates no known allergies.  Home Medications  No current outpatient prescriptions on file. BP 113/62  Pulse 94  Temp(Src) 98.7 F (37.1 C) (Oral)  Resp 16  Ht 5' (1.524 m)  Wt 164 lb (74.39 kg)  BMI 32.03 kg/m2  SpO2 98%  LMP 11/27/2012 Physical Exam  Nursing note and vitals reviewed. Constitutional: She appears well-developed and well-nourished. No distress.  HENT:  Head: Normocephalic and atraumatic.  Mouth/Throat: Oropharynx is clear and moist.  Cardiovascular: Normal rate, regular rhythm and normal heart sounds.   Pulmonary/Chest: Effort normal and breath sounds normal.  Abdominal: Soft. Bowel sounds are normal. She exhibits no distension and no mass. There is no tenderness. There is no rebound and no guarding.  Musculoskeletal: Normal range of motion.  Neurological: She is alert.  Skin: Skin is warm and dry. She is not diaphoretic.  Psychiatric: She has a normal mood and affect.    ED Course   Procedures (including critical care time)  Labs Reviewed  CBC WITH DIFFERENTIAL - Abnormal; Notable for the following:    Hemoglobin 10.1 (*)    HCT 33.3 (*)    MCV 70.4 (*)    MCH 21.4 (*)    RDW 17.7 (*)    Platelets 420 (*)    Neutrophils Relative % 39 (*)    Lymphocytes Relative 48 (*)    All other components within normal limits  COMPREHENSIVE METABOLIC PANEL - Abnormal; Notable for the following:    Potassium 3.2 (*)    Total Bilirubin 0.1 (*)    All other components within normal limits  LIPASE, BLOOD  URINALYSIS, ROUTINE W REFLEX MICROSCOPIC   No results found. No diagnosis found.  MDM  Patient presenting with a chief  complaint of diarrhea.  Yesterday she had vomiting, but that has since resolved.  Patient tolerating PO liquids.  Patient is afebrile.  No abdominal pain on exam.  Labs unremarkable.  Feel that symptoms are most consistent with Viral Gastroenteritis.  Patient stable for discharge.  Patient instructed to take Imodium.  Return precautions given.  Pascal Lux [place], PA-C 12/26/12 1228  Pascal Lux [place], PA-C 12/26/12 1229

## 2012-12-26 NOTE — ED Provider Notes (Signed)
Medical screening examination/treatment/procedure(s) were performed by non-physician practitioner and as supervising physician I was immediately available for consultation/collaboration.  Jones Skene, M.D.     Jones Skene, MD 12/26/12 2256

## 2013-01-18 ENCOUNTER — Emergency Department (INDEPENDENT_AMBULATORY_CARE_PROVIDER_SITE_OTHER)
Admission: EM | Admit: 2013-01-18 | Discharge: 2013-01-18 | Disposition: A | Discharge status: Home / Self Care | Payer: Medicaid Other | Source: Home / Self Care | Attending: Emergency Medicine | Admitting: Emergency Medicine

## 2013-01-18 ENCOUNTER — Encounter (HOSPITAL_COMMUNITY): Payer: Self-pay | Admitting: *Deleted

## 2013-01-18 DIAGNOSIS — R197 Diarrhea, unspecified: Secondary | ICD-10-CM

## 2013-01-18 MED ORDER — DIPHENOXYLATE-ATROPINE 2.5-0.025 MG PO TABS: 1.0000 | ORAL_TABLET | Freq: Four times a day (QID) | ORAL | Status: DC | PRN

## 2013-01-18 NOTE — ED Provider Notes (Signed)
CSN: [id number]     Arrival date & time 01/18/13  1725 History   First MD Initiated Contact with Patient 01/18/13 1839     Chief Complaint  Patient presents with  . Diarrhea   (Consider location/radiation/quality/duration/timing/severity/associated sxs/prior Treatment) Patient is a 22 y.o. female presenting with vomiting. The history is provided by the patient. No language interpreter was used.  Emesis Severity:  Moderate Duration:  2 days Timing:  Intermittent Number of daily episodes:  4 Able to tolerate:  Liquids Progression:  Partially resolved Chronicity:  New Relieved by:  Nothing Associated symptoms: abdominal pain and diarrhea   Risk factors: no alcohol use and no diabetes     Past Medical History  Diagnosis Date  . Mental disorder     ADHD   Past Surgical History  Procedure Laterality Date  . No past surgeries     Family History  Problem Relation Age of Onset  . Diabetes Mother   . Kidney disease Mother   . Pancreatitis Mother   . Gout Father   . Cancer Maternal Grandmother   . Gout Paternal Grandmother   . Diabetes Paternal Grandmother    History  Substance Use Topics  . Smoking status: Former Smoker    Types: Cigars    Quit date: 05/10/2012  . Smokeless tobacco: Never Used     Comment: 2 black and milds per day; admits to during pregnancy although denied use during clinic visits  . Alcohol Use: No   OB History   Grav Para Term Preterm Abortions TAB SAB Ect Mult Living   2 2 2  0 0 0 0 0 0 2     Review of Systems  Gastrointestinal: Positive for vomiting, abdominal pain and diarrhea.  All other systems reviewed and are negative.    Allergies  Review of patient's allergies indicates no known allergies.  Home Medications   Current Outpatient Rx  Name  Route  Sig  Dispense  Refill  . loperamide (IMODIUM) 2 MG capsule   Oral   Take 1 capsule (2 mg total) by mouth 4 (four) times daily as needed for diarrhea or loose stools.   12 capsule   0    . ondansetron (ZOFRAN ODT) 4 MG disintegrating tablet   Oral   Take 1 tablet (4 mg total) by mouth every 8 (eight) hours as needed for nausea.   20 tablet   0    BP 124/65  Pulse 63  Temp(Src) 98.4 F (36.9 C) (Oral)  Resp 18  SpO2 100%  LMP 11/27/2012  Breastfeeding? No Physical Exam  Nursing note and vitals reviewed. Constitutional: She appears well-developed and well-nourished.  HENT:  Head: Normocephalic and atraumatic.  Eyes: Pupils are equal, round, and reactive to light.  Neck: Normal range of motion.  Cardiovascular: Normal rate.   Pulmonary/Chest: Effort normal.  Abdominal: Soft. There is no tenderness. There is no rebound and no guarding.  Musculoskeletal: Normal range of motion.  Neurological: She is alert.  Skin: Skin is warm.  Psychiatric: She has a normal mood and affect.    ED Course  Procedures (including critical care time) Labs Review Labs Reviewed - No data to display Imaging Review No results found.  MDM   1. Diarrhea     Pt is getting better,  She is not pregnant.  She is on depo.   Pt given lomotil to help with diarrhea,  Pt encourage to drink plenty of fluids.     Elson Areas, PA-C  01/18/13 1939 

## 2013-01-18 NOTE — ED Notes (Addendum)
Started with stomach ache yesterday - "hurts about every 10 min, but not cramping"; pain moves between umbilical area and low abdomen.  Yesterday was vomiting; no emesis today.  C/O nausea and continued diarrhea (x4 today).  Has not taken any meds.  Noted in chart that pt had same sxs approx 1 month ago - states feels the same as it did then (was given Imodium and Zofran, but was unable to fill Rxs).

## 2013-01-18 NOTE — ED Provider Notes (Signed)
Medical screening examination/treatment/procedure(s) were performed by non-physician practitioner and as supervising physician I was immediately available for consultation/collaboration.  Leslee Home, M.D.  Reuben Likes, MD 01/18/13 2212

## 2013-02-20 ENCOUNTER — Ambulatory Visit: Payer: Medicaid Other

## 2013-03-12 ENCOUNTER — Ambulatory Visit: Payer: Medicaid Other

## 2013-04-09 ENCOUNTER — Ambulatory Visit (INDEPENDENT_AMBULATORY_CARE_PROVIDER_SITE_OTHER): Payer: Medicaid Other | Admitting: *Deleted

## 2013-04-09 DIAGNOSIS — Z309 Encounter for contraceptive management, unspecified: Secondary | ICD-10-CM

## 2013-04-09 MED ORDER — MEDROXYPROGESTERONE ACETATE 150 MG/ML IM SUSP
150.0000 mg | Freq: Once | INTRAMUSCULAR | Status: AC
  Administered 2013-04-09: 150 mg via INTRAMUSCULAR

## 2013-04-09 NOTE — Progress Notes (Signed)
Patient in today for depo, patient a week late so urine pregnancy obtained, with negative results. Injection given in right ventrogluteal, patient without complaints, site unremarkable. Next depo due February 10 - February 24, patient aware.

## 2013-07-12 ENCOUNTER — Ambulatory Visit: Payer: Medicaid Other

## 2013-07-16 ENCOUNTER — Ambulatory Visit (INDEPENDENT_AMBULATORY_CARE_PROVIDER_SITE_OTHER): Payer: Medicaid Other | Admitting: *Deleted

## 2013-07-16 DIAGNOSIS — Z309 Encounter for contraceptive management, unspecified: Secondary | ICD-10-CM

## 2013-07-16 LAB — POCT URINE PREGNANCY: PREG TEST UR: NEGATIVE

## 2013-07-16 MED ORDER — MEDROXYPROGESTERONE ACETATE 150 MG/ML IM SUSP
150.0000 mg | Freq: Once | INTRAMUSCULAR | Status: AC
  Administered 2013-07-16: 150 mg via INTRAMUSCULAR

## 2013-07-16 NOTE — Progress Notes (Signed)
   Pt in for Depo Provera injection. Late for injection, pregnancy test ordered. Pregnancy test negative.  Last sex 2 weeks ago, offered condoms, but declined.   Pt tolerated Depo injection. Depo given Left upper outer quadrant.  Next injection due May 19 - October 15, 2013.  Reminder card given. Clovis PuMartin, Jimel Myler L, RN

## 2013-10-01 ENCOUNTER — Ambulatory Visit: Payer: Medicaid Other

## 2013-10-16 ENCOUNTER — Ambulatory Visit (INDEPENDENT_AMBULATORY_CARE_PROVIDER_SITE_OTHER): Payer: Medicaid Other | Admitting: *Deleted

## 2013-10-16 DIAGNOSIS — Z309 Encounter for contraceptive management, unspecified: Secondary | ICD-10-CM

## 2013-10-16 LAB — POCT URINE PREGNANCY: PREG TEST UR: NEGATIVE

## 2013-10-16 MED ORDER — MEDROXYPROGESTERONE ACETATE 150 MG/ML IM SUSP
150.0000 mg | Freq: Once | INTRAMUSCULAR | Status: AC
  Administered 2013-10-16: 150 mg via INTRAMUSCULAR

## 2013-10-16 NOTE — Progress Notes (Signed)
   Pt one day late for Depo Provera injection.  Pregnancy test ordered; results negative.  Pt tolerated Depo injection. Depo given Right upper outer quadrant.  Next injection due August 19 - Sept 2, 2015.  Reminder card given. Clovis Pu, RN

## 2013-10-26 ENCOUNTER — Emergency Department (HOSPITAL_COMMUNITY)
Admission: EM | Admit: 2013-10-26 | Discharge: 2013-10-26 | Disposition: A | Discharge status: Home / Self Care | Payer: Medicaid Other | Attending: Emergency Medicine | Admitting: Emergency Medicine

## 2013-10-26 ENCOUNTER — Encounter (HOSPITAL_COMMUNITY): Payer: Self-pay | Admitting: Emergency Medicine

## 2013-10-26 DIAGNOSIS — H00019 Hordeolum externum unspecified eye, unspecified eyelid: Secondary | ICD-10-CM | POA: Insufficient documentation

## 2013-10-26 DIAGNOSIS — H02846 Edema of left eye, unspecified eyelid: Secondary | ICD-10-CM

## 2013-10-26 DIAGNOSIS — Z87891 Personal history of nicotine dependence: Secondary | ICD-10-CM | POA: Insufficient documentation

## 2013-10-26 DIAGNOSIS — F489 Nonpsychotic mental disorder, unspecified: Secondary | ICD-10-CM | POA: Insufficient documentation

## 2013-10-26 MED ORDER — PREDNISONE 20 MG PO TABS: 40.0000 mg | ORAL_TABLET | Freq: Every day | ORAL | Status: DC

## 2013-10-26 MED ORDER — ERYTHROMYCIN 5 MG/GM OP OINT: TOPICAL_OINTMENT | OPHTHALMIC | Status: DC

## 2013-10-26 NOTE — ED Notes (Signed)
Per pt sts 3 days of left eye swelling. sts painful.

## 2013-10-26 NOTE — Discharge Instructions (Signed)
Take the prescribed medication as directed.  Continue warm compresses at home 3-4 times daily, 20 minutes at a time. Follow-up with eye doctor if you begin experiencing trouble with your vision. Return to the ED for new or worsening symptoms.

## 2013-10-26 NOTE — ED Provider Notes (Signed)
CSN: [id number]     Arrival date & time 10/26/13  [id number] History   First MD Initiated Contact with Patient 10/26/13 0735     Chief Complaint  Patient presents with  . Eye Problem     (Consider location/radiation/quality/duration/timing/severity/associated sxs/prior Treatment) Patient is a 23 y.o. female presenting with eye problem. The history is provided by the patient and medical records.  Eye Problem  This is a 23 y.o. F with PMH of ADHD, presenting to the ED for left eye swelling x3 days.  Pt states her left lower eyelid has gotten progressively more swollen and is now TTP.  She has been applying warm compresses without noted improvement.  Denies any trauma to eye.  No chemical or FB exposure to eye.  Denies visual disturbance.  Pt does not wear glasses or contact lenses.  No fever or chills.  Denies pain of orbit itself, only eyelid.  Past Medical History  Diagnosis Date  . Mental disorder     ADHD   Past Surgical History  Procedure Laterality Date  . No past surgeries     Family History  Problem Relation Age of Onset  . Diabetes Mother   . Kidney disease Mother   . Pancreatitis Mother   . Gout Father   . Cancer Maternal Grandmother   . Gout Paternal Grandmother   . Diabetes Paternal Grandmother    History  Substance Use Topics  . Smoking status: Former Smoker    Types: Cigars    Quit date: 05/10/2012  . Smokeless tobacco: Never Used     Comment: 2 black and milds per day; admits to during pregnancy although denied use during clinic visits  . Alcohol Use: No   OB History   Grav Para Term Preterm Abortions TAB SAB Ect Mult Living   2 2 2  0 0 0 0 0 0 2     Review of Systems  Eyes:       Eyelid swelling  All other systems reviewed and are negative.     Allergies  Review of patient's allergies indicates no known allergies.  Home Medications   Prior to Admission medications   Not on File   BP 116/63  Pulse 85  Temp(Src) 98 F (36.7 C) (Oral)  Resp 20   SpO2 99%  Physical Exam  Nursing note and vitals reviewed. Constitutional: She is oriented to person, place, and time. She appears well-developed and well-nourished.  HENT:  Head: Normocephalic and atraumatic.  Mouth/Throat: Oropharynx is clear and moist.  Eyes: Conjunctivae, EOM and lids are normal. Pupils are equal, round, and reactive to light. Left eye exhibits hordeolum.  Left lower eyelid is swollen with small pimple structure along inner margin consistent with stye; no drainage noted; no upper lid edema or erythema; conjunctiva non-injected; EOM intact and non-painful; no FB noted; PERRL Right eye normal  Neck: Normal range of motion. Neck supple.  Cardiovascular: Normal rate, regular rhythm and normal heart sounds.   Pulmonary/Chest: Effort normal and breath sounds normal.  Musculoskeletal: Normal range of motion.  Neurological: She is alert and oriented to person, place, and time.  Skin: Skin is warm and dry.  Psychiatric: She has a normal mood and affect.    ED Course  Procedures (including critical care time) Labs Review Labs Reviewed - No data to display  Imaging Review No results found.   EKG Interpretation None      MDM   Final diagnoses:  Stye  Swelling of left eyelid  Sx most consistent with stye of left lower eyelid.  No upper lid edema, lid erythema, or painful EOM to suggest orbital or pre-septal cellulitis.  Her vision is WNL.  Pt started on erythromycin ointment and prednisone.  She will FU with opthalamology.  Discussed plan with patient, he/she acknowledged understanding and agreed with plan of care.  Return precautions given for new or worsening symptoms.  Garlon HatchetLisa M Yedidya Duddy, PA-C 10/26/13 [phone]

## 2013-10-27 NOTE — ED Provider Notes (Signed)
Medical screening examination/treatment/procedure(s) were performed by non-physician practitioner and as supervising physician I was immediately available for consultation/collaboration.   EKG Interpretation None        Audree CamelScott T Jazier Mcglamery, MD 10/27/13 [phone]

## 2013-12-31 ENCOUNTER — Ambulatory Visit (INDEPENDENT_AMBULATORY_CARE_PROVIDER_SITE_OTHER): Payer: Medicaid Other | Admitting: *Deleted

## 2013-12-31 DIAGNOSIS — Z3049 Encounter for surveillance of other contraceptives: Secondary | ICD-10-CM

## 2013-12-31 DIAGNOSIS — Z3042 Encounter for surveillance of injectable contraceptive: Secondary | ICD-10-CM

## 2013-12-31 MED ORDER — MEDROXYPROGESTERONE ACETATE 150 MG/ML IM SUSP
150.0000 mg | Freq: Once | INTRAMUSCULAR | Status: AC
  Administered 2013-12-31: 150 mg via INTRAMUSCULAR

## 2013-12-31 NOTE — Progress Notes (Signed)
   Pt a day early for Depo Provera injection.  Verbal order by Dr. Jennette KettleNeal to give Depo Provera today.  Pt tolerated Depo injection. Depo given left upper outer quadrant.  Next injection due Nov 3-Apr 01, 2014.  Reminder card given. Clovis PuMartin, Manraj Yeo L, RN

## 2014-05-26 ENCOUNTER — Ambulatory Visit (INDEPENDENT_AMBULATORY_CARE_PROVIDER_SITE_OTHER): Payer: Medicaid Other | Admitting: *Deleted

## 2014-05-26 DIAGNOSIS — Z3042 Encounter for surveillance of injectable contraceptive: Secondary | ICD-10-CM | POA: Diagnosis not present

## 2014-05-26 LAB — POCT URINE PREGNANCY: PREG TEST UR: NEGATIVE

## 2014-05-26 MED ORDER — MEDROXYPROGESTERONE ACETATE 150 MG/ML IM SUSP
150.0000 mg | Freq: Once | INTRAMUSCULAR | Status: AC
  Administered 2014-05-26: 150 mg via INTRAMUSCULAR

## 2014-05-26 NOTE — Progress Notes (Signed)
   Pt late for Depo Provera injection. Pregnancy test ordered; results negative.  Per pt last sex activity 2 weeks ago. Pt advised to use another reliable form of birth control x 7-10 days. Pt tolerated Depo injection. Depo given left upper outer quadrant.  Next injection due March 29-August 26, 2014.  Reminder card given. Clovis PuMartin, Miarose Lippert L, RN

## 2014-08-11 ENCOUNTER — Ambulatory Visit (INDEPENDENT_AMBULATORY_CARE_PROVIDER_SITE_OTHER): Payer: Medicaid Other | Admitting: *Deleted

## 2014-08-11 DIAGNOSIS — Z3042 Encounter for surveillance of injectable contraceptive: Secondary | ICD-10-CM | POA: Diagnosis present

## 2014-08-11 MED ORDER — MEDROXYPROGESTERONE ACETATE 150 MG/ML IM SUSP
150.0000 mg | Freq: Once | INTRAMUSCULAR | Status: AC
  Administered 2014-08-11: 150 mg via INTRAMUSCULAR

## 2014-08-11 NOTE — Progress Notes (Signed)
   Pt a day early for Depo Provera injection.  Verbal order given by Dr. Caleb PoppNettey to give Depo Provera today. Pt tolerated Depo injection. Depo given right upper outer quadrant.  Next injection due June 13-November 10, 2014.  Pt advised to have an annual exam done with or before next injection.  Reminder card given. Clovis PuMartin, Duyen Beckom L, RN

## 2014-09-11 ENCOUNTER — Encounter (HOSPITAL_COMMUNITY): Payer: Self-pay

## 2014-09-11 ENCOUNTER — Emergency Department (HOSPITAL_COMMUNITY)
Admission: EM | Admit: 2014-09-11 | Discharge: 2014-09-11 | Disposition: A | Discharge status: Home / Self Care | Payer: Medicaid Other | Attending: Emergency Medicine | Admitting: Emergency Medicine

## 2014-09-11 DIAGNOSIS — Z8659 Personal history of other mental and behavioral disorders: Secondary | ICD-10-CM | POA: Insufficient documentation

## 2014-09-11 DIAGNOSIS — Z87891 Personal history of nicotine dependence: Secondary | ICD-10-CM | POA: Insufficient documentation

## 2014-09-11 DIAGNOSIS — K0889 Other specified disorders of teeth and supporting structures: Secondary | ICD-10-CM

## 2014-09-11 DIAGNOSIS — K088 Other specified disorders of teeth and supporting structures: Secondary | ICD-10-CM | POA: Diagnosis not present

## 2014-09-11 DIAGNOSIS — Z7952 Long term (current) use of systemic steroids: Secondary | ICD-10-CM | POA: Insufficient documentation

## 2014-09-11 DIAGNOSIS — H9201 Otalgia, right ear: Secondary | ICD-10-CM | POA: Insufficient documentation

## 2014-09-11 MED ORDER — OXYCODONE-ACETAMINOPHEN 5-325 MG PO TABS: 1.0000 | ORAL_TABLET | ORAL | Status: DC | PRN

## 2014-09-11 MED ORDER — AMOXICILLIN 500 MG PO CAPS: 500.0000 mg | ORAL_CAPSULE | Freq: Three times a day (TID) | ORAL | Status: DC

## 2014-09-11 NOTE — ED Provider Notes (Signed)
CSN: [id number]     Arrival date & time 09/11/14  1557 History  This chart was scribed for non-physician practitioner, Arthor CaptainAbigail Sandy Blouch, PA-C working with Shon Batonourtney F Horton, MD, by Abel PrestoKara Demonbreun, ED Scribe. This patient was seen in room WTR7/WTR7 and the patient's care was started at 4:54 PM.    Chief Complaint  Patient presents with  . Dental Pain  . Otalgia    Patient is a 24 y.o. female presenting with tooth pain and ear pain. The history is provided by the patient. No language interpreter was used.  Dental Pain Associated symptoms: no fever   Otalgia Associated symptoms: no ear discharge and no fever    HPI Comments: Kylie Bell is a 24 y.o. female who presents to the Emergency Department complaining of right sided upper dental pain with onset last night. She reports her wisdom tooth is cracked and states a piece of it came off in her mouth. Pt states pain radiates to right ear. She is tearful in exam. Pt denies ear discharge, dysphagia, fever, and chills.  Past Medical History  Diagnosis Date  . Mental disorder     ADHD   Past Surgical History  Procedure Laterality Date  . No past surgeries     Family History  Problem Relation Age of Onset  . Diabetes Mother   . Kidney disease Mother   . Pancreatitis Mother   . Gout Father   . Cancer Maternal Grandmother   . Gout Paternal Grandmother   . Diabetes Paternal Grandmother    History  Substance Use Topics  . Smoking status: Former Smoker    Types: Cigars    Quit date: 05/10/2012  . Smokeless tobacco: Never Used     Comment: 2 black and milds per day; admits to during pregnancy although denied use during clinic visits  . Alcohol Use: No   OB History    Gravida Para Term Preterm AB TAB SAB Ectopic Multiple Living   2 2 2  0 0 0 0 0 0 2     Review of Systems  Constitutional: Negative for fever and chills.  HENT: Positive for dental problem and ear pain. Negative for ear discharge and trouble swallowing.       Allergies  Review of patient's allergies indicates no known allergies.  Home Medications   Prior to Admission medications   Medication Sig Start Date End Date Taking? Authorizing Provider  erythromycin ophthalmic ointment Place a 1/2 inch ribbon of ointment into the lower eyelid. 10/26/13   Garlon HatchetLisa M Sanders, PA-C  predniSONE (DELTASONE) 20 MG tablet Take 2 tablets (40 mg total) by mouth daily. Take 40 mg by mouth daily for 3 days, then 20mg  by mouth daily for 3 days, then 10mg  daily for 3 days 10/26/13   Garlon HatchetLisa M Sanders, PA-C   BP 121/76 mmHg  Pulse 76  Temp(Src) 98.9 F (37.2 C) (Oral)  Resp 20  SpO2 99% Physical Exam  Constitutional: She is oriented to person, place, and time. She appears well-developed and well-nourished.  HENT:  Head: Normocephalic.  Pain to upper right 2nd molar; no fracture noted  Eyes: Conjunctivae are normal.  Neck: Normal range of motion. Neck supple.  Pulmonary/Chest: Effort normal.  Musculoskeletal: Normal range of motion.  Neurological: She is alert and oriented to person, place, and time.  Skin: Skin is warm and dry.  Psychiatric: She has a normal mood and affect. Her behavior is normal.  Nursing note and vitals reviewed.   ED Course  Procedures (including critical care time) DIAGNOSTIC STUDIES: Oxygen Saturation is 99% on room air, normal by my interpretation.    COORDINATION OF CARE: 4:57 PM Discussed treatment plan with patient at beside, the patient agrees with the plan and has no further questions at this time.   Labs Review Labs Reviewed - No data to display  Imaging Review No results found.   EKG Interpretation None      MDM   Final diagnoses:  None   Patient with toothache.  No gross abscess.  Exam unconcerning for Ludwig's angina or spread of infection.  Will treat with penicillin and pain medicine.  Urged patient to follow-up with dentist.     I personally performed the services described in this documentation, which  was scribed in my presence. The recorded information has been reviewed and is accurate.        Arthor Captain, PA-C 09/15/14 2108  Shon Baton, MD 09/16/14 [phone]

## 2014-09-11 NOTE — Discharge Instructions (Signed)
Dental Pain °A tooth ache may be caused by cavities (tooth decay). Cavities expose the nerve of the tooth to air and hot or cold temperatures. It may come from an infection or abscess (also called a boil or furuncle) around your tooth. It is also often caused by dental caries (tooth decay). This causes the pain you are having. °DIAGNOSIS  °Your caregiver can diagnose this problem by exam. °TREATMENT  °· If caused by an infection, it may be treated with medications which kill germs (antibiotics) and pain medications as prescribed by your caregiver. Take medications as directed. °· Only take over-the-counter or prescription medicines for pain, discomfort, or fever as directed by your caregiver. °· Whether the tooth ache today is caused by infection or dental disease, you should see your dentist as soon as possible for further care. °SEEK MEDICAL CARE IF: °The exam and treatment you received today has been provided on an emergency basis only. This is not a substitute for complete medical or dental care. If your problem worsens or new problems (symptoms) appear, and you are unable to meet with your dentist, call or return to this location. °SEEK IMMEDIATE MEDICAL CARE IF:  °· You have a fever. °· You develop redness and swelling of your face, jaw, or neck. °· You are unable to open your mouth. °· You have severe pain uncontrolled by pain medicine. °MAKE SURE YOU:  °· Understand these instructions. °· Will watch your condition. °· Will get help right away if you are not doing well or get worse. °Document Released: 05/02/2005 Document Revised: 07/25/2011 Document Reviewed: 12/19/2007 °ExitCare® Patient Information ©2015 [employer]. This information is not intended to replace advice given to you by your health care provider. Make sure you discuss any questions you have with your health care provider. ° °Emergency Department Resource Guide °1) Find a Doctor and Pay Out of Pocket °Although you won't have to find out who  is covered by your insurance plan, it is a good idea to ask around and get recommendations. You will then need to call the office and see if the doctor you have chosen will accept you as a new patient and what types of options they offer for patients who are self-pay. Some doctors offer discounts or will set up payment plans for their patients who do not have insurance, but you will need to ask so you aren't surprised when you get to your appointment. ° °2) Contact Your Local Health Department °Not all health departments have doctors that can see patients for sick visits, but many do, so it is worth a call to see if yours does. If you don't know where your local health department is, you can check in your phone book. The CDC also has a tool to help you locate your state's health department, and many state websites also have listings of all of their local health departments. ° °3) Find a Walk-in Clinic °If your illness is not likely to be very severe or complicated, you may want to try a walk in clinic. These are popping up all over the country in pharmacies, drugstores, and shopping centers. They're usually staffed by nurse practitioners or physician assistants that have been trained to treat common illnesses and complaints. They're usually fairly quick and inexpensive. However, if you have serious medical issues or chronic medical problems, these are probably not your best option. ° °No Primary Care Doctor: °- Call [facility] at  832-8000 - they can help you locate a primary   care doctor that  accepts your insurance, provides certain services, etc. °- Physician Referral Service- 1-800-533-3463 ° °Chronic Pain Problems: °Organization         Address  Phone   Notes  °[facility]  (336) 297-2271 Patients need to be referred by their primary care doctor.  ° °Medication Assistance: °Organization         Address  Phone   Notes  °[place] [facility] [address]  [address] °[place], [place] [address] (336) 641-8030 --Must be a resident of [place] °-- Must have NO insurance coverage whatsoever (no Medicaid/ Medicare, etc.) °-- The pt. MUST have a primary care doctor that directs their care regularly and follows them in the community °  °[facility]  (866) 331-1348   °[employer]  (888) 892-1162   ° °Agencies that provide inexpensive medical care: °Organization         Address  Phone   Notes  °[facility]  (336) 832-8035   °[facility]    (336) 832-7272   °[facility] [address] °[place], [place] [address] (336) 832-4777   °[facility] [address], °[place] (336) 271-4999   °[facility]    (336) 373-0678   °[facility]    (336) 272-1050   °[facility] ° [address], [place] Phone:  (336) 832-4444, Fax:  (336) 832-4440 Hours of Operation:  9 am - 6 pm, M-F.  Also accepts Medicaid/Medicare and self-pay.  °[facility] ° [address], [place] Phone: (336) 832-3150, Fax: (336) 832-3151. Hours of Operation:  8:30 am - 5:30 pm, M-F.  Also accepts Medicaid and self-pay.  °[facility] [place] [address], [place] Phone: (336) 878-6027   °[facility] [address], [place], [place] (336)723-1848, Ext. 123 Mondays & Thursdays: 7-9 AM.  First 15 patients are seen on a first come, first serve basis. °  ° °Medicaid-accepting [place] Providers: ° °Organization         Address  Phone   Notes  °[facility] [address], [place] (336) 641-2100 Also accepts self-pay patients.  °[facility] [address], [place] ° (336) 856-9996   °[facility] [address], [place] (336) 288-8857   °[facility] [address] [place] [address], [place] (336) 299-7000   °Veita Bland [address], [place]  ° (336) 373-1557 Only accepts [place] Access Medicaid patients after they have their name applied to their card.  ° °Self-Pay (no insurance) in [place]: ° °Organization         Address  Phone   Notes  °Sickle Cell Patients, [facility] [address], [place] (336) 832-1970   °[facility] [address], [place] (336) 832-4400   °[facility] ° [address], [place] (336) 992-4800   °[facility]/Dr. Osei-Bonsu ° [address] [place] [address], [place] or [address], [place] (336) 841-8500 Phone number for both [place] and [place] locations is the same.  °[facility] [address], [place] (336) 299-0000   °[facility] [address] [place] [address],  or [address] (336) 852-7530 °(336) 878-2260   °  [facility] [address], [place] (336) 350-1642, phone; (336) 294-5005, fax Sees patients 1st and 3rd Saturday of every month.  Must not qualify for public or private insurance (i.e. Medicaid, Medicare, [place] Health Choice, Veterans' Benefits) • Household income should be no more than 200% of the poverty level •The clinic cannot treat you if you are pregnant or think you are pregnant • Sexually transmitted diseases are not treated at the clinic.  ° ° °Dental Care: °Organization         Address  Phone  Notes  °[facility] [facility] [address], [place] (336) 641-6152 Accepts children up to age 21 who are enrolled in Medicaid or [place] Health Choice; pregnant women with a Medicaid card; and children who have applied for Medicaid or [place] Health Choice, but were declined, whose parents can pay a reduced fee at time of service.  °[facility] [place]  [address], [place] (336) 641-7733 Accepts children up to age 21 who are  enrolled in Medicaid or [place] Health Choice; pregnant women with a Medicaid card; and children who have applied for Medicaid or [place] Health Choice, but were declined, whose parents can pay a reduced fee at time of service.  °[facility] ° [address], [place] (336) 641-4533 Patients are seen by appointment only. Walk-ins are not accepted. [facility] will see patients 18 years of age and older. °Monday - Tuesday (8am-5pm) °Most Wednesdays (8:30-5pm) °$30 per visit, cash only  °[facility] ° [address], [place] (336) 641-4533 Patients are seen by appointment only. Walk-ins are not accepted. [facility] will see patients 18 years of age and older. °One Wednesday Evening (Monthly: Volunteer Based).  $30 per visit, cash only  °[facility]  (919) 537-3737 for adults; Children under age 4, call Graduate Pediatric Dentistry at ([phone]) [phone]. Children aged 4-14, please call (919) [phone] to request a pediatric application. ° Dental services are provided in all areas of dental care including fillings, crowns and bridges, complete and partial dentures, implants, gum treatment, root canals, and extractions. Preventive care is also provided. Treatment is provided to both adults and children. °Patients are selected via a lottery and there is often a waiting list. °  °[facility] [address], ° ° (336) 763-8833 [email or web address] °  °[facility] [address], [place], [place] (336)723-1848, Ext. 123 Second and Fourth Thursday of each month, opens at 6:30 AM; Clinic ends at 9 AM.  Patients are seen on a first-come first-served basis, and a limited number are seen during each clinic.  ° °[facility] ° [address], [place], [place] (336) 723-7904   Eligibility Requirements °You must have lived in [place], [place], or [place] counties for at least the last three months. °  You  cannot be eligible for state or federal sponsored healthcare insurance, including Veterans Administration, Medicaid, or Medicare. °  You generally cannot be eligible for healthcare insurance through your employer.  °  How to apply: °Eligibility screenings are held every Tuesday and Wednesday afternoon from 1:00 pm until 4:00 pm. You do not need an appointment for the interview!  °[facility] [address], [place], [place] 336-631-2330   °[facility]  336-342-8273   °[facility]  336-703-3100   °[facility]  336-570-6415   ° °Behavioral Health Resources in the Community: °Intensive Outpatient Programs °Organization         Address  Phone  Notes  °[facility] [address], [place], [place] 336-878-6098   °[facility] [address], [place], [place] 336-832-9800   °[facility] [address], [place], [place] ° 336-882-2125   °[facility] [address],  °[place], [place] 1-800-853-5163 or 336-641-4981   °Substance Abuse Resources °Organization         Address  Phone  Notes  °[facility]  336-882-2125   °[facility]  336-784-9470   °[facility]  336-285-9073   °[facility]  336-845-3988   °Residential & Outpatient Substance Abuse Program  1-800-659-3381   °Psychological Services °Organization         Address  Phone  Notes  °[facility]  336- 832-9600   °[facility]  336- 378-7881   °[facility] [address], [place] 1-800-853-5163 or 336-641-4981   ° °Mobile Crisis Teams °Organization         Address  Phone  Notes  °[facility], Mobile Crisis Care Unit  1-877-626-1772   °Assertive °[facility] ° [address] [place], [place] 336-834-9664   °Sharon DeEsch [address] °[place] 336-554-5454   ° °Self-Help/Support  Groups °Organization         Address  Phone             Notes  °[facility] - variety of support groups  336- 373-1402 Call for more information  °Narcotics Anonymous (NA), [facility] [address], °[place]  2 meetings at this location  ° °Residential Treatment Programs °Organization         Address  Phone  Notes  °[facility] [address],    °[place]  1-866-801-8205   °[facility] ° [address], [place], [place] 704-293-8524   °[facility] [address], [place] 336-845-3988 Admissions: 8am-3pm M-F  °[facility] [address]    °[place], [place] 336-841-1104   °[facility] [address], [place], [place] 336-379-7146   °[facility] [address]  °[place], [place] 336-285-9073   °[facility] Intensive Outpatient [address], [place], [place] 336-852-3033   °[facility] [address]  °[place], [place] 1-877-615-2722 or 336-784-9470   °[facility] [address] [place], [place] 336-227-7417 Accepts Medicaid  °[facility] [address]  °[place] 1-800-659-3381 Substance Abuse/Addiction Treatment  ° °[place] Behavioral Health Resources °Organization         Address  Phone  Notes  °[facility]  (888) 581-9988   °Julie Brannon, PhD [address] [place], [place]   (336) 349-5553 or (336) 951-0000   °[facility]   [address] °[place], [place] (336) 349-4454   °[facility] [address], [place], [place] (336) 342-8316 Insurance/Medicaid/sponsorship through [facility]  °[facility] [address]                                    [place],  (336) 342-8316 Therapy/tele-psych/case  °[facility]   [address]  ° [place], [place] (336) 349-2233    °Dr. Arfeen  (336) 349-4544   °[facility] [place]  [employer] [place] [facility] 1) [address], [place] °2) [address], [place] °3)  [address], [place] (336) 349-3220 °(336) 342-7768 ° °(336) 342-8140   °[place] [facility] (336) 342-1394 or (336) 342-3537 (After Hours)    ° ° ° °

## 2014-09-11 NOTE — ED Notes (Addendum)
Pt presents with c/o dental pain on the right side of her face. Pt reports the pain started last night. Pt also reports her tooth is cracked and now her right ear is hurting too. Pt reports she has been taking aleve, last dose was 2 hours ago.

## 2015-01-28 ENCOUNTER — Ambulatory Visit (INDEPENDENT_AMBULATORY_CARE_PROVIDER_SITE_OTHER): Payer: Medicaid Other | Admitting: *Deleted

## 2015-01-28 DIAGNOSIS — Z3042 Encounter for surveillance of injectable contraceptive: Secondary | ICD-10-CM | POA: Diagnosis not present

## 2015-01-28 LAB — POCT URINE PREGNANCY: PREG TEST UR: NEGATIVE

## 2015-01-28 MED ORDER — MEDROXYPROGESTERONE ACETATE 150 MG/ML IM SUSP
150.0000 mg | Freq: Once | INTRAMUSCULAR | Status: AC
  Administered 2015-01-28: 150 mg via INTRAMUSCULAR

## 2015-01-28 NOTE — Progress Notes (Signed)
   Pt late for Depo Provera injection.  Pregnancy test ordered; results negative.  Advised patient to use another reliable form of birth control for 7-14 days.  Pt tolerated Depo injection. Depo given right upper outer quadrant.  Next injection due Nov. 30-Dec. 14, 2016.  Reminder card given. Clovis Pu, RN

## 2015-07-03 ENCOUNTER — Emergency Department (HOSPITAL_COMMUNITY)
Admission: EM | Admit: 2015-07-03 | Discharge: 2015-07-03 | Disposition: A | Discharge status: Home / Self Care | Payer: Medicaid Other | Attending: Emergency Medicine | Admitting: Emergency Medicine

## 2015-07-03 ENCOUNTER — Encounter (HOSPITAL_COMMUNITY): Payer: Self-pay | Admitting: Emergency Medicine

## 2015-07-03 DIAGNOSIS — K0381 Cracked tooth: Secondary | ICD-10-CM | POA: Diagnosis not present

## 2015-07-03 DIAGNOSIS — K0889 Other specified disorders of teeth and supporting structures: Secondary | ICD-10-CM | POA: Insufficient documentation

## 2015-07-03 DIAGNOSIS — Z792 Long term (current) use of antibiotics: Secondary | ICD-10-CM | POA: Diagnosis not present

## 2015-07-03 DIAGNOSIS — Z8659 Personal history of other mental and behavioral disorders: Secondary | ICD-10-CM | POA: Insufficient documentation

## 2015-07-03 DIAGNOSIS — K002 Abnormalities of size and form of teeth: Secondary | ICD-10-CM | POA: Diagnosis not present

## 2015-07-03 DIAGNOSIS — Z87891 Personal history of nicotine dependence: Secondary | ICD-10-CM | POA: Diagnosis not present

## 2015-07-03 MED ORDER — PENICILLIN V POTASSIUM 500 MG PO TABS: 500.0000 mg | ORAL_TABLET | Freq: Four times a day (QID) | ORAL | Status: AC

## 2015-07-03 MED ORDER — OXYCODONE-ACETAMINOPHEN 5-325 MG PO TABS
1.0000 | ORAL_TABLET | Freq: Once | ORAL | Status: AC
  Administered 2015-07-03: 1 via ORAL
  Filled 2015-07-03: qty 1

## 2015-07-03 NOTE — Discharge Instructions (Signed)
Use OTC pain relievers such as tylenol or motrin. Use topical pain relievers such as orajel. It is important to keep your dentist appointment next week.   Dental Pain    Dental pain may be caused by many things, including:  Tooth decay (cavities or caries). Cavities expose the nerve of your tooth to air and hot or cold temperatures. This can cause pain or discomfort.  Abscess or infection. A dental abscess is a collection of infected pus from a bacterial infection in the inner part of the tooth (pulp). It usually occurs at the end of the tooth's root.  Injury.  An unknown reason (idiopathic). Your pain may be mild or severe. It may only occur when:  You are chewing.  You are exposed to hot or cold temperature.  You are eating or drinking sugary foods or beverages, such as soda or candy. Your pain may also be constant.  HOME CARE INSTRUCTIONS  Watch your dental pain for any changes. The following actions may help to lessen any discomfort that you are feeling:  Take medicines only as directed by your dentist.  If you were prescribed an antibiotic medicine, finish all of it even if you start to feel better.  Keep all follow-up visits as directed by your dentist. This is important.  Do not apply heat to the outside of your face.  Rinse your mouth or gargle with salt water if directed by your dentist. This helps with pain and swelling.  You can make salt water by adding  tsp of salt to 1 cup of warm water. Apply ice to the painful area of your face:  Put ice in a plastic bag.  Place a towel between your skin and the bag.  Leave the ice on for 20 minutes, 2-3 times per day. Avoid foods or drinks that cause you pain, such as:  Very hot or very cold foods or drinks.  Sweet or sugary foods or drinks. SEEK MEDICAL CARE IF:  Your pain is not controlled with medicines.  Your symptoms are worse.  You have new symptoms. SEEK IMMEDIATE MEDICAL CARE IF:  You are unable to open your mouth.  You  are having trouble breathing or swallowing.  You have a fever.  Your face, neck, or jaw is swollen. This information is not intended to replace advice given to you by your health care provider. Make sure you discuss any questions you have with your health care provider.  Document Released: 05/02/2005 Document Revised: 09/16/2014 Document Reviewed: 04/28/2014  Elsevier Interactive Patient Education [employer]

## 2015-07-03 NOTE — ED Notes (Signed)
C/o right upper and lower dental pain. States has dentist appointment next week.

## 2015-07-03 NOTE — ED Notes (Signed)
Pt ambulates independently and with steady gait at time of discharge. Discharge instructions and follow up information reviewed with patient. No other questions or concerns voiced at this time. RX x 1 given. 

## 2015-07-03 NOTE — ED Provider Notes (Signed)
CSN: [id number]     Arrival date & time 07/03/15  1155 History  By signing my name below, I, Elon Spanner, attest that this documentation has been prepared under the direction and in the presence of Dajion Bickford, PA-C. Electronically Signed: Elon Spanner ED Scribe. 07/03/2015. 12:34 PM. '  Chief Complaint  Patient presents with  . Dental Pain   Patient is a 25 y.o. female presenting with tooth pain. The history is provided by the patient. No language interpreter was used.  Dental Pain Location:  Upper and lower Upper teeth location:  3/RU 1st molar Lower teeth location:  31/RL 2nd molar Quality:  Aching Severity:  Moderate Duration:  3 weeks Timing:  Constant Progression:  Unchanged Context: dental caries and dental fracture   Context: not abscess and not crown fracture   Relieved by:  Nothing Worsened by:  Cold food/drink and hot food/drink Ineffective treatments:  Acetaminophen and NSAIDs Associated symptoms: facial swelling   Associated symptoms: no difficulty swallowing, no drooling, no facial pain, no fever, no gum swelling, no neck pain, no neck swelling and no trismus    HPI Comments: LAKRISHA ISEMAN is a 25 y.o. female who presents to the Emergency Department complaining of constant, worsening right upper/lower dental pain radiating to the right ear onset 3 weeks ago. The pain is aching. She also notes some swelling around the right lower jaw and has used Tylenol and Motrin without relief.  The pain is worse with eating, which is the primary reason the patient came to the ED today. The patient has dental f/u scheduled for 2/22.  She denies trouble handling secretions, swallowing or breathing. Denies fevers, chills, neck pain, neck swelling, redness of the cheek or neck or shortness of breath.     Past Medical History  Diagnosis Date  . Mental disorder     ADHD   Past Surgical History  Procedure Laterality Date  . No past surgeries     Family History  Problem Relation  Age of Onset  . Diabetes Mother   . Kidney disease Mother   . Pancreatitis Mother   . Gout Father   . Cancer Maternal Grandmother   . Gout Paternal Grandmother   . Diabetes Paternal Grandmother    Social History  Substance Use Topics  . Smoking status: Former Smoker    Types: Cigars    Quit date: 05/10/2012  . Smokeless tobacco: Never Used     Comment: 2 black and milds per day; admits to during pregnancy although denied use during clinic visits  . Alcohol Use: No   OB History    Gravida Para Term Preterm AB TAB SAB Ectopic Multiple Living   0 0 0 0 0 0 2     Review of Systems  Constitutional: Negative for fever.  HENT: Positive for dental problem and facial swelling. Negative for drooling.   Musculoskeletal: Negative for neck pain.  All other systems reviewed and are negative.     Allergies  Review of patient's allergies indicates no known allergies.  Home Medications   Prior to Admission medications   Medication Sig Start Date End Date Taking? Authorizing Provider  amoxicillin (AMOXIL) 500 MG capsule Take 1 capsule (500 mg total) by mouth 3 (three) times daily. 09/11/14   Arthor Captain, PA-C  erythromycin ophthalmic ointment Place a 1/2 inch ribbon of ointment into the lower eyelid. Patient not taking: Reported on 09/11/2014 10/26/13   Garlon Hatchet, PA-C  ibuprofen (ADVIL,MOTRIN) 200  MG tablet Take 200 mg by mouth every 4 (four) hours as needed for headache or moderate pain.    Historical Provider, MD  oxyCODONE-acetaminophen (PERCOCET) 5-325 MG per tablet Take 1-2 tablets by mouth every 4 (four) hours as needed. 09/11/14   Arthor Captain, PA-C  penicillin v potassium (VEETID) 500 MG tablet Take 1 tablet (500 mg total) by mouth 4 (four) times daily. 07/03/15 07/10/15  Rolm Gala Loys Shugars, PA-C  predniSONE (DELTASONE) 20 MG tablet Take 2 tablets (40 mg total) by mouth daily. Take 40 mg by mouth daily for 3 days, then  by mouth daily for 3 days, then  daily for 3  days Patient not taking: Reported on 09/11/2014 10/26/13   Garlon Hatchet, PA-C   BP 112/62 mmHg  Pulse 71  Temp(Src) 98.3 F (36.8 C)  Resp 16  SpO2 99% Physical Exam  Constitutional: She is oriented to person, place, and time. She appears well-developed and well-nourished. No distress.  HENT:  Head: Normocephalic and atraumatic.  Right Ear: External ear normal.  Left Ear: External ear normal.  Mouth/Throat: Uvula is midline and oropharynx is clear and moist. Mucous membranes are not dry. No trismus in the jaw. No dental abscesses or uvula swelling.    Old dental fracture noted to right upper first molar and of lower second molar. Poor dentition throughout. No erythema or drainage from the surrounding gumline. No obvious dental abscess. No trismus. No erythema or soft tissue swelling of the cheek.   Eyes: Conjunctivae and EOM are normal. Right eye exhibits no discharge. Left eye exhibits no discharge. No scleral icterus.  Neck: Normal range of motion. Neck supple. No tracheal deviation present.  FROM of neck intact without pain. No TTP. No cervical adenopathy. No soft tissue swelling of the neck.   Cardiovascular: Normal rate.   Pulmonary/Chest: Effort normal. No stridor. No respiratory distress.  Musculoskeletal: Normal range of motion.  Lymphadenopathy:    She has no cervical adenopathy.  Neurological: She is alert and oriented to person, place, and time. Coordination normal.  Skin: Skin is warm and dry.  Psychiatric: She has a normal mood and affect. Her behavior is normal.  Nursing note and vitals reviewed.   ED Course  Procedures (including critical care time)  DIAGNOSTIC STUDIES: Oxygen Saturation is 99% on RA, normal by my interpretation.    COORDINATION OF CARE:  12:34 PM Discussed plan to order pain medication and prescribe antibiotics.  Patient should f/u with dentis as scheduled.  Patient acknowledges and agrees with plan.    Labs Review Labs Reviewed - No data  to display  Imaging Review No results found. I have personally reviewed and evaluated these images and lab results as part of my medical decision-making.   EKG Interpretation None      MDM   Final diagnoses:  Dentalgia   Patient presenting with tooth pain. No obvious abscess on exam. No soft tissue swelling of the cheek or neck. Exam unconcerning for Ludwig's angina or spread of infection.  Pain controlled in ED with perocet. Will discharge with penicillin and encouraged pt to keep follow up with her dentist. Instructed to use OTC pain relievers including tylenol, motrin and orajel. Return precautions discussed with pt and given in discharge paperwork. Stable for discharge.   I personally performed the services described in this documentation, which was scribed in my presence. The recorded information has been reviewed and is accurate.    Alveta Heimlich, PA-C 07/03/15 1259  Mancel Bale, MD 07/03/15  1519 

## 2015-08-04 ENCOUNTER — Encounter (HOSPITAL_COMMUNITY): Payer: Self-pay | Admitting: Emergency Medicine

## 2015-08-04 ENCOUNTER — Emergency Department (HOSPITAL_COMMUNITY)
Admission: EM | Admit: 2015-08-04 | Discharge: 2015-08-04 | Disposition: A | Discharge status: Other Acute Inpatient Hospital | Payer: Medicaid Other

## 2015-08-04 ENCOUNTER — Emergency Department (INDEPENDENT_AMBULATORY_CARE_PROVIDER_SITE_OTHER)
Admission: EM | Admit: 2015-08-04 | Discharge: 2015-08-04 | Disposition: A | Discharge status: Home / Self Care | Payer: Medicaid Other | Source: Home / Self Care | Attending: Family Medicine | Admitting: Family Medicine

## 2015-08-04 DIAGNOSIS — R110 Nausea: Secondary | ICD-10-CM | POA: Diagnosis not present

## 2015-08-04 DIAGNOSIS — J069 Acute upper respiratory infection, unspecified: Secondary | ICD-10-CM

## 2015-08-04 DIAGNOSIS — R0982 Postnasal drip: Secondary | ICD-10-CM

## 2015-08-04 LAB — POCT PREGNANCY, URINE: PREG TEST UR: NEGATIVE

## 2015-08-04 LAB — POCT URINALYSIS DIP (DEVICE)
Bilirubin Urine: NEGATIVE
GLUCOSE, UA: NEGATIVE mg/dL
LEUKOCYTES UA: NEGATIVE
NITRITE: NEGATIVE
Protein, ur: 30 mg/dL — AB
SPECIFIC GRAVITY, URINE: 1.025 (ref 1.005–1.030)
UROBILINOGEN UA: 0.2 mg/dL (ref 0.0–1.0)
pH: 6 (ref 5.0–8.0)

## 2015-08-04 MED ORDER — ONDANSETRON HCL 4 MG PO TABS: 4.0000 mg | ORAL_TABLET | Freq: Three times a day (TID) | ORAL | Status: AC | PRN

## 2015-08-04 NOTE — Discharge Instructions (Signed)
Nausea, Adult Nausea is the feeling that you have an upset stomach or have to vomit. Nausea by itself is not likely a serious concern, but it may be an early sign of more serious medical problems. As nausea gets worse, it can lead to vomiting. If vomiting develops, there is the risk of dehydration.  CAUSES   Viral infections.  Food poisoning.  Medicines.  Pregnancy.  Motion sickness.  Migraine headaches.  Emotional distress.  Severe pain from any source.  Alcohol intoxication. HOME CARE INSTRUCTIONS  Get plenty of rest.  Ask your caregiver about specific rehydration instructions.  Eat small amounts of food and sip liquids more often.  Take all medicines as told by your caregiver. SEEK MEDICAL CARE IF:  You have not improved after 2 days, or you get worse.  You have a headache. SEEK IMMEDIATE MEDICAL CARE IF:   You have a fever.  You faint.  You keep vomiting or have blood in your vomit.  You are extremely weak or dehydrated.  You have dark or bloody stools.  You have severe chest or abdominal pain. MAKE SURE YOU:  Understand these instructions.  Will watch your condition.  Will get help right away if you are not doing well or get worse.   This information is not intended to replace advice given to you by your health care provider. Make sure you discuss any questions you have with your health care provider.   Document Released: 06/09/2004 Document Revised: 05/23/2014 Document Reviewed: 01/12/2011 Elsevier Interactive Patient Education 2016 [employer]  Upper Respiratory Infection, Adult For nasal and head congestion may take Sudafed PE 10 mg every 4 hours as needed. Saline nasal spray used frequently. For drainage may use Allegra, Claritin or Zyrtec. If you need stronger medicine to stop drainage may take Chlor-Trimeton 2-4 mg every 4 hours. This may cause drowsiness. Ibuprofen 600 mg every 6 hours as needed for pain, discomfort or fever. If this  causes nausea use tylenol instead. Drink plenty of fluids and stay well-hydrated. Zofran for nausea  Most upper respiratory infections (URIs) are a viral infection of the air passages leading to the lungs. A URI affects the nose, throat, and upper air passages. The most common type of URI is nasopharyngitis and is typically referred to as "the common cold." URIs run their course and usually go away on their own. Most of the time, a URI does not require medical attention, but sometimes a bacterial infection in the upper airways can follow a viral infection. This is called a secondary infection. Sinus and middle ear infections are common types of secondary upper respiratory infections. Bacterial pneumonia can also complicate a URI. A URI can worsen asthma and chronic obstructive pulmonary disease (COPD). Sometimes, these complications can require emergency medical care and may be life threatening.  CAUSES Almost all URIs are caused by viruses. A virus is a type of germ and can spread from one person to another.  RISKS FACTORS You may be at risk for a URI if:   You smoke.   You have chronic heart or lung disease.  You have a weakened defense (immune) system.   You are very young or very old.   You have nasal allergies or asthma.  You work in crowded or poorly ventilated areas.  You work in health care facilities or schools. SIGNS AND SYMPTOMS  Symptoms typically develop 2-3 days after you come in contact with a cold virus. Most viral URIs last 7-10 days. However, viral URIs  from the influenza virus (flu virus) can last 14-18 days and are typically more severe. Symptoms may include:   Runny or stuffy (congested) nose.   Sneezing.   Cough.   Sore throat.   Headache.   Fatigue.   Fever.   Loss of appetite.   Pain in your forehead, behind your eyes, and over your cheekbones (sinus pain).  Muscle aches.  DIAGNOSIS  Your health care provider may diagnose a URI  by:  Physical exam.  Tests to check that your symptoms are not due to another condition such as:  Strep throat.  Sinusitis.  Pneumonia.  Asthma. TREATMENT  A URI goes away on its own with time. It cannot be cured with medicines, but medicines may be prescribed or recommended to relieve symptoms. Medicines may help:  Reduce your fever.  Reduce your cough.  Relieve nasal congestion. HOME CARE INSTRUCTIONS   Take medicines only as directed by your health care provider.   Gargle warm saltwater or take cough drops to comfort your throat as directed by your health care provider.  Use a warm mist humidifier or inhale steam from a shower to increase air moisture. This may make it easier to breathe.  Drink enough fluid to keep your urine clear or pale yellow.   Eat soups and other clear broths and maintain good nutrition.   Rest as needed.   Return to work when your temperature has returned to normal or as your health care provider advises. You may need to stay home longer to avoid infecting others. You can also use a face mask and careful hand washing to prevent spread of the virus.  Increase the usage of your inhaler if you have asthma.   Do not use any tobacco products, including cigarettes, chewing tobacco, or electronic cigarettes. If you need help quitting, ask your health care provider. PREVENTION  The best way to protect yourself from getting a cold is to practice good hygiene.   Avoid oral or hand contact with people with cold symptoms.   Wash your hands often if contact occurs.  There is no clear evidence that vitamin C, vitamin E, echinacea, or exercise reduces the chance of developing a cold. However, it is always recommended to get plenty of rest, exercise, and practice good nutrition.  SEEK MEDICAL CARE IF:   You are getting worse rather than better.   Your symptoms are not controlled by medicine.   You have chills.  You have worsening shortness of  breath.  You have brown or red mucus.  You have yellow or brown nasal discharge.  You have pain in your face, especially when you bend forward.  You have a fever.  You have swollen neck glands.  You have pain while swallowing.  You have white areas in the back of your throat. SEEK IMMEDIATE MEDICAL CARE IF:   You have severe or persistent:  Headache.  Ear pain.  Sinus pain.  Chest pain.  You have chronic lung disease and any of the following:  Wheezing.  Prolonged cough.  Coughing up blood.  A change in your usual mucus.  You have a stiff neck.  You have changes in your:  Vision.  Hearing.  Thinking.  Mood. MAKE SURE YOU:   Understand these instructions.  Will watch your condition.  Will get help right away if you are not doing well or get worse.   This information is not intended to replace advice given to you by your health care provider.  Make sure you discuss any questions you have with your health care provider.   Document Released: 10/26/2000 Document Revised: 09/16/2014 Document Reviewed: 08/07/2013 Elsevier Interactive Patient Education [employer]

## 2015-08-04 NOTE — ED Provider Notes (Signed)
CSN: [id number]     Arrival date & time 08/04/15  1301 History   First MD Initiated Contact with Patient 08/04/15 1330     Chief Complaint  Patient presents with  . GI Problem  . Sinus Problem   (Consider location/radiation/quality/duration/timing/severity/associated sxs/prior Treatment) HPI Comments: 25 year old female complaining of a cough, anterior chest pain associated with the cough and headache for 2 days. She has also had nausea with no vomiting and decreased appetite. Denies documented fever. Denies runny nose that has had nasal congestion. Appears generally well. She is smiling ,talkative and showing no signs of distress.   Past Medical History  Diagnosis Date  . Mental disorder     ADHD   Past Surgical History  Procedure Laterality Date  . No past surgeries     Family History  Problem Relation Age of Onset  . Diabetes Mother   . Kidney disease Mother   . Pancreatitis Mother   . Gout Father   . Cancer Maternal Grandmother   . Gout Paternal Grandmother   . Diabetes Paternal Grandmother    Social History  Substance Use Topics  . Smoking status: Former Smoker    Types: Cigars    Quit date: 05/10/2012  . Smokeless tobacco: Never Used     Comment: 2 black and milds per day; admits to during pregnancy although denied use during clinic visits  . Alcohol Use: No   OB History    Gravida Para Term Preterm AB TAB SAB Ectopic Multiple Living   0 0 0 0 0 0 2     Review of Systems  Constitutional: Positive for activity change and appetite change. Negative for fever, chills and fatigue.  HENT: Positive for postnasal drip and rhinorrhea. Negative for facial swelling and sore throat.   Eyes: Negative.   Respiratory: Positive for cough.   Cardiovascular: Negative.   Gastrointestinal: Positive for nausea. Negative for vomiting and abdominal pain.  Genitourinary: Negative.   Musculoskeletal: Negative for neck pain and neck stiffness.  Skin: Negative for pallor and  rash.  Neurological: Negative.   All other systems reviewed and are negative.   Allergies  Review of patient's allergies indicates no known allergies.  Home Medications   Prior to Admission medications   Medication Sig Start Date End Date Taking? Authorizing Provider  ibuprofen (ADVIL,MOTRIN) 200 MG tablet Take 200 mg by mouth every 4 (four) hours as needed for headache or moderate pain.    Historical Provider, MD  ondansetron (ZOFRAN) 4 MG tablet Take 1 tablet (4 mg total) by mouth every 8 (eight) hours as needed for nausea. May cause constipation 08/04/15   Hayden Rasmussen, NP   Meds Ordered and Administered this Visit  Medications - No data to display  BP 107/72 mmHg  Pulse 91  Temp(Src) 98.8 F (37.1 C) (Oral)  Resp 12  SpO2 98% No data found.   Physical Exam  Constitutional: She is oriented to person, place, and time. She appears well-developed and well-nourished. No distress.  HENT:  Mouth/Throat: No oropharyngeal exudate.  Bilateral TMs are normal. Oropharynx with clear PND, minimal erythema and some cobblestoning.  Eyes: Conjunctivae and EOM are normal.  Neck: Normal range of motion. Neck supple.  Cardiovascular: Normal rate, regular rhythm and normal heart sounds.   Pulmonary/Chest: Effort normal and breath sounds normal. No respiratory distress. She has no wheezes. She has no rales.  Abdominal: Soft. There is no tenderness.  Musculoskeletal: Normal range of motion. She exhibits no edema.  Lymphadenopathy:    She has no cervical adenopathy.  Neurological: She is alert and oriented to person, place, and time.  Skin: Skin is warm and dry. No rash noted.  Psychiatric: She has a normal mood and affect.  Nursing note and vitals reviewed.   ED Course  Procedures (including critical care time)  Labs Review Labs Reviewed  POCT URINALYSIS DIP (DEVICE) - Abnormal; Notable for the following:    Ketones, ur TRACE (*)    Hgb urine dipstick TRACE (*)    Protein, ur 30 (*)     All other components within normal limits  POCT PREGNANCY, URINE   Results for orders placed or performed during the hospital encounter of 08/04/15  POCT urinalysis dip (device)  Result Value Ref Range   Glucose, UA NEGATIVE NEGATIVE mg/dL   Bilirubin Urine NEGATIVE NEGATIVE   Ketones, ur TRACE (A) NEGATIVE mg/dL   Specific Gravity, Urine 1.025 1.005 - 1.030   Hgb urine dipstick TRACE (A) NEGATIVE   pH 6.0 5.0 - 8.0   Protein, ur 30 (A) NEGATIVE mg/dL   Urobilinogen, UA 0.2 0.0 - 1.0 mg/dL   Nitrite NEGATIVE NEGATIVE   Leukocytes, UA NEGATIVE NEGATIVE  Pregnancy, urine POC  Result Value Ref Range   Preg Test, Ur NEGATIVE NEGATIVE     Imaging Review No results found.   Visual Acuity Review  Right Eye Distance:   Left Eye Distance:   Bilateral Distance:    Right Eye Near:   Left Eye Near:    Bilateral Near:         MDM   1. URI (upper respiratory infection)   2. PND (post-nasal drip)   3. Nausea    Upper Respiratory Infection, Adult For nasal and head congestion may take Sudafed PE 10 mg every 4 hours as needed. Saline nasal spray used frequently. For drainage may use Allegra, Claritin or Zyrtec. If you need stronger medicine to stop drainage may take Chlor-Trimeton 2-4 mg every 4 hours. This may cause drowsiness. Ibuprofen 600 mg every 6 hours as needed for pain, discomfort or fever. If this causes nausea use tylenol instead. Drink plenty of fluids and stay well-hydrated. Zofran for nausea Meds ordered this encounter  Medications  . ondansetron (ZOFRAN) 4 MG tablet    Sig: Take 1 tablet (4 mg total) by mouth every 8 (eight) hours as needed for nausea. May cause constipation    Dispense:  12 tablet    Refill:  0    Order Specific Question:  Supervising Provider    Answer:  Linna HoffKINDL, JAMES D [[id number]]       Hayden Rasmussenavid Ashad Fawbush, NP 08/04/15 1358

## 2015-08-04 NOTE — ED Notes (Signed)
Patient stated "I'm leaving to go to the urgent care"

## 2015-08-04 NOTE — ED Notes (Signed)
Pt here with c/o cough, nasal congestion with sinus pressure that started 2 days ago Now c/o Gi upset n,v,diarrhea and poor po intake No medication tried vss
